# Patient Record
Sex: Male | Born: 1950 | Race: White | Hispanic: No | Smoking: Current every day smoker
Health system: Southern US, Community
[De-identification: ages and names within clinical notes are randomized; demographics above are authoritative.]

## PROBLEM LIST (undated history)

## (undated) DIAGNOSIS — J449 Chronic obstructive pulmonary disease, unspecified: Secondary | ICD-10-CM

## (undated) DIAGNOSIS — I2510 Atherosclerotic heart disease of native coronary artery without angina pectoris: Secondary | ICD-10-CM

## (undated) HISTORY — PX: NO PAST SURGERIES: SHX2092

---

## 2007-06-16 ENCOUNTER — Other Ambulatory Visit: Payer: Self-pay

## 2007-06-16 ENCOUNTER — Emergency Department: Payer: Self-pay | Admitting: Emergency Medicine

## 2010-10-19 ENCOUNTER — Inpatient Hospital Stay: Payer: Self-pay | Admitting: Internal Medicine

## 2010-12-12 ENCOUNTER — Ambulatory Visit: Payer: Self-pay | Admitting: Internal Medicine

## 2016-11-03 ENCOUNTER — Emergency Department
Admission: EM | Admit: 2016-11-03 | Discharge: 2016-11-04 | Disposition: A | Discharge status: Home / Self Care | Payer: Medicare Other | Attending: Emergency Medicine | Admitting: Emergency Medicine

## 2016-11-03 ENCOUNTER — Encounter: Payer: Self-pay | Admitting: Emergency Medicine

## 2016-11-03 DIAGNOSIS — H8149 Vertigo of central origin, unspecified ear: Secondary | ICD-10-CM | POA: Diagnosis not present

## 2016-11-03 DIAGNOSIS — F17210 Nicotine dependence, cigarettes, uncomplicated: Secondary | ICD-10-CM | POA: Diagnosis not present

## 2016-11-03 DIAGNOSIS — J449 Chronic obstructive pulmonary disease, unspecified: Secondary | ICD-10-CM | POA: Insufficient documentation

## 2016-11-03 DIAGNOSIS — R42 Dizziness and giddiness: Secondary | ICD-10-CM | POA: Diagnosis present

## 2016-11-03 HISTORY — DX: Chronic obstructive pulmonary disease, unspecified: J44.9

## 2016-11-03 LAB — CBC
HCT: 46.6 % (ref 40.0–52.0)
Hemoglobin: 15.6 g/dL (ref 13.0–18.0)
MCH: 29.5 pg (ref 26.0–34.0)
MCHC: 33.6 g/dL (ref 32.0–36.0)
MCV: 88 fL (ref 80.0–100.0)
PLATELETS: 237 10*3/uL (ref 150–440)
RBC: 5.29 MIL/uL (ref 4.40–5.90)
RDW: 13.8 % (ref 11.5–14.5)
WBC: 9.4 10*3/uL (ref 3.8–10.6)

## 2016-11-03 LAB — COMPREHENSIVE METABOLIC PANEL
ALBUMIN: 4.2 g/dL (ref 3.5–5.0)
ALK PHOS: 60 U/L (ref 38–126)
ALT: 17 U/L (ref 17–63)
AST: 20 U/L (ref 15–41)
Anion gap: 8 (ref 5–15)
BUN: 11 mg/dL (ref 6–20)
CALCIUM: 8.9 mg/dL (ref 8.9–10.3)
CO2: 26 mmol/L (ref 22–32)
CREATININE: 0.91 mg/dL (ref 0.61–1.24)
Chloride: 104 mmol/L (ref 101–111)
GFR calc Af Amer: >60 mL/min (ref >60)
GFR calc non Af Amer: >60 mL/min (ref >60)
GLUCOSE: 98 mg/dL (ref 65–99)
Potassium: 3.8 mmol/L (ref 3.5–5.1)
SODIUM: 138 mmol/L (ref 135–145)
Total Bilirubin: 0.9 mg/dL (ref 0.3–1.2)
Total Protein: 7.4 g/dL (ref 6.5–8.1)

## 2016-11-03 LAB — URINALYSIS, COMPLETE (UACMP) WITH MICROSCOPIC
Bacteria, UA: NONE SEEN
Bilirubin Urine: NEGATIVE
Glucose, UA: NEGATIVE mg/dL
Ketones, ur: NEGATIVE mg/dL
Nitrite: NEGATIVE
PH: 5 (ref 5.0–8.0)
Protein, ur: NEGATIVE mg/dL
SPECIFIC GRAVITY, URINE: 1.019 (ref 1.005–1.030)

## 2016-11-03 LAB — TROPONIN I: Troponin I: <0.03 ng/mL (ref <0.03)

## 2016-11-03 NOTE — ED Triage Notes (Signed)
Pt to triage via w/c with no distress noted; pt reports intermittent vertigo since Friday accomp by diaphoresis, nausea; denies hx of same

## 2016-11-04 ENCOUNTER — Encounter: Payer: Self-pay | Admitting: Radiology

## 2016-11-04 ENCOUNTER — Emergency Department: Payer: Medicare Other

## 2016-11-04 MED ORDER — SODIUM CHLORIDE 0.9 % IV BOLUS (SEPSIS)
1000.0000 mL | Freq: Once | INTRAVENOUS | Status: AC
  Administered 2016-11-04: 1000 mL via INTRAVENOUS

## 2016-11-04 MED ORDER — MECLIZINE HCL 25 MG PO TABS: 50.0000 mg | ORAL_TABLET | Freq: Two times a day (BID) | ORAL | 0 refills | Status: DC | PRN

## 2016-11-04 MED ORDER — IOPAMIDOL (ISOVUE-370) INJECTION 76%
75.0000 mL | Freq: Once | INTRAVENOUS | Status: AC | PRN
  Administered 2016-11-04: 75 mL via INTRAVENOUS

## 2016-11-04 MED ORDER — MECLIZINE HCL 25 MG PO TABS
50.0000 mg | ORAL_TABLET | Freq: Once | ORAL | Status: AC
  Administered 2016-11-04: 50 mg via ORAL
  Filled 2016-11-04: qty 2

## 2016-11-04 NOTE — ED Provider Notes (Signed)
[facility] Emergency Department Provider Note   First MD Initiated Contact with Patient 11/03/16 2348     (approximate)  I have reviewed the triage vital signs and the nursing notes.   HISTORY  Chief Complaint Dizziness    HPI Frederick Brewer is a 66 y.o. male with history of COPD presents emergency department with intermittent dizziness since Thursday (6 days) (the patient states sensation of the room spinning. Patient denies any headache no weakness numbness gait instability or visual changes. Patient does admit onset of dizziness.   Past Medical History:  Diagnosis Date  . COPD (chronic obstructive pulmonary disease) (HCC)     There are no active problems to display for this patient.   History reviewed. No pertinent surgical history.  Prior to Admission medications   Medication Sig Start Date End Date Taking? Authorizing Provider  meclizine (ANTIVERT) 25 MG tablet Take 2 tablets (50 mg total) by mouth 2 (two) times daily as needed. 11/04/16   Darci Current, MD    Allergies No known drug allergies No family history on file.  Social History Social History  Substance Use Topics  . Smoking status: Current Every Day Smoker    Packs/day: 1.00    Types: Cigarettes  . Smokeless tobacco: Never Used  . Alcohol use Not on file    Review of Systems Constitutional: No fever/chills Eyes: No visual changes. ENT: No sore throat. Cardiovascular: Denies chest pain. Respiratory: Denies shortness of breath. Gastrointestinal: No abdominal pain.  No nausea, no vomiting.  No diarrhea.  No constipation. Genitourinary: Negative for dysuria. Musculoskeletal: Negative for neck pain.  Negative for back pain. Integumentary: Negative for rash. Neurological: Negative for headaches, focal weakness or numbness.Positive for dizziness   ____________________________________________   PHYSICAL EXAM:  VITAL SIGNS: ED Triage Vitals [11/03/16 2119]  Enc  Vitals Group     BP (!) 176/80     Pulse Rate 81     Resp 18     Temp 98.4 F (36.9 C)     Temp Source Oral     SpO2 97 %     Weight 81.6 kg (180 lb)     Height 1.803 m (5\' 11" )     Head Circumference      Peak Flow      Pain Score      Pain Loc      Pain Edu?      Excl. in GC?     Constitutional: Alert and oriented. Well appearing and in no acute distress. Eyes: Conjunctivae are normal. PERRL. EOMI. Head: Atraumatic. Ears:  Healthy appearing ear canals and TMs bilaterally Nose: No congestion/rhinnorhea. Mouth/Throat: Mucous membranes are moist.  Oropharynx non-erythematous. Neck: No stridor. Cardiovascular: Normal rate, regular rhythm. Good peripheral circulation. Grossly normal heart sounds. Respiratory: Normal respiratory effort.  No retractions. Lungs CTAB. Gastrointestinal: Soft and nontender. No distention.  Musculoskeletal: No lower extremity tenderness nor edema. No gross deformities of extremities. Neurologic:  Normal speech and language. No gross focal neurologic deficits are appreciated.  Skin:  Skin is warm, dry and intact. No rash noted. Psychiatric: Mood and affect are normal. Speech and behavior are normal.  ____________________________________________   LABS (all labs ordered are listed, but only abnormal results are displayed)  Labs Reviewed  URINALYSIS, COMPLETE (UACMP) WITH MICROSCOPIC - Abnormal; Notable for the following:       Result Value   Color, Urine YELLOW (*)    APPearance CLEAR (*)    Hgb urine dipstick  MODERATE (*)    Leukocytes, UA TRACE (*)    Squamous Epithelial / LPF 0-5 (*)    All other components within normal limits  CBC  TROPONIN I  COMPREHENSIVE METABOLIC PANEL   ____________________________________________  EKG ED ECG REPORT I, Villanueva N BROWN, the attending physician, personally viewed and interpreted this ECG.   Date: 11/05/2016  EKG Time: 9:27 PM  Rate: 84  Rhythm: Normal sinus rhythm  Axis: Normal  Intervals:  Normal  ST&T Change: None   CLINICAL DATA:  Intermittent vertigo for 5 days, diaphoresis and nausea. Assess for subarachnoid hemorrhage or aneurysm. Smoker, history of COPD.  EXAM: CT ANGIOGRAPHY HEAD  TECHNIQUE: Multidetector CT imaging of the head was performed using the standard protocol during bolus administration of intravenous contrast. Multiplanar CT image reconstructions and MIPs were obtained to evaluate the vascular anatomy.  CONTRAST:  100 cc Isovue 370  COMPARISON:  CT HEAD July 17, 2007  FINDINGS: CT HEAD  BRAIN: No intraparenchymal hemorrhage, mass effect nor midline shift. The ventricles and sulci are normal for age ; mild chronic asymmetry of the lateral ventricles may be developmental. Small area RIGHT parietal encephalomalacia is unchanged. No acute large vascular territory infarcts. No abnormal extra-axial fluid collections. Basal cisterns are patent.  VASCULAR: Moderate calcific atherosclerosis of the carotid siphons.  SKULL: No skull fracture. No significant scalp soft tissue swelling.  SINUSES/ORBITS: The mastoid air-cells and included paranasal sinuses are well-aerated.The included ocular globes and orbital contents are non-suspicious.  OTHER: None.  CTA HEAD  ANTERIOR CIRCULATION: Patent cervical internal carotid arteries, petrous, cavernous and supra clinoid internal carotid arteries. Patent fenestrated anterior communicating artery. Patent anterior and middle cerebral arteries.  No large vessel occlusion, significant stenosis, contrast extravasation or aneurysm.  POSTERIOR CIRCULATION: Codominant vertebral artery's. Patent vertebral arteries, vertebrobasilar junction and basilar artery, as well as main branch vessels. Patent posterior cerebral arteries. Small RIGHT posterior communicating artery present.  No large vessel occlusion, significant stenosis, contrast extravasation or aneurysm.  VENOUS SINUSES: Major  dural venous sinuses are patent though not tailored for evaluation on this angiographic examination.  ANATOMIC VARIANTS: None.  DELAYED PHASE: No abnormal intracranial enhancement.  MIP images reviewed.  IMPRESSION: CT HEAD:  1. No acute intracranial process. 2. 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 3. Otherwise negative noncontrast CT HEAD for age. CTA HEAD:  1. No aneurysm; negative CTA of the head.   Electronically Signed   By: Awilda Metro M.D.   On: 11/04/2016 01:12   Procedures   ____________________________________________   INITIAL IMPRESSION / ASSESSMENT AND PLAN / ED COURSE  Pertinent labs & imaging results that were available during my care of the patient were reviewed by me and considered in my medical decision making (see chart for details).  Patient given meclizine 50 mg with resolution of symptoms. 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 Will prescribe meclizine for the patient for home.      ____________________________________________  FINAL CLINICAL IMPRESSION(S) / ED DIAGNOSES  Final diagnoses:  Vertigo     MEDICATIONS GIVEN DURING THIS VISIT:  Medications  sodium chloride 0.9 % bolus 1,000 mL (0 mLs Intravenous Stopped 11/04/16 0331)  meclizine (ANTIVERT) tablet 50 mg (50 mg Oral Given 11/04/16 0013)  iopamidol (ISOVUE-370) 76 % injection 75 mL (75 mLs Intravenous Contrast Given 11/04/16 0031)     NEW OUTPATIENT MEDICATIONS  STARTED DURING THIS VISIT:  Discharge Medication List as of 11/04/2016  3:16 AM    START taking these medications   Details  meclizine (ANTIVERT) 25 MG tablet Take 2 tablets (50 mg total) by mouth 2 (two) times daily as needed., Starting Wed 11/04/2016, Print        Discharge Medication List as of 11/04/2016  3:16 AM      Discharge  Medication List as of 11/04/2016  3:16 AM       Note:  This document was prepared using Dragon voice recognition software and may include unintentional dictation errors.    Darci CurrentBrown, [place], MD 11/05/16 [phone]

## 2016-11-04 NOTE — ED Notes (Signed)
Pt returned to ED Rm 9 from CT at this time. Pt reports significant improvement in his presenting c/o's. Pt requesting door to be closed and lights dimmed so he can sleep until CT scan results are available. Bed in lowest position and wheels locked; call bell within reach and pt instructed to call-out for any needs or concerns.

## 2016-11-04 NOTE — ED Notes (Signed)
Pt declined wheelchair, ambulatory without difficulty, steady gait noted. 

## 2016-11-04 NOTE — ED Notes (Signed)
Patient transported to CT at this time. 

## 2018-11-22 IMAGING — CT CT ANGIO HEAD
4 of 11 series · 16 of 47 positions shown · IV contrast (APPLIED)
Comparison: CT HEAD July 17, 2007

CLINICAL DATA: Intermittent vertigo for 5 days, diaphoresis and
nausea. Assess for subarachnoid hemorrhage or aneurysm. Smoker,
history of COPD.

EXAM:
CT ANGIOGRAPHY HEAD
TECHNIQUE: Multidetector CT imaging of the head was performed using the
standard protocol during bolus administration of intravenous
contrast. Multiplanar CT image reconstructions and MIPs were
obtained to evaluate the vascular anatomy.
CONTRAST:  100 cc Isovue 370

[Series 8: cta head · axial · 0.45mm/px · z∈[-38,+58]mm · 5 of 72 slices shown]
[im 12/72  brain]
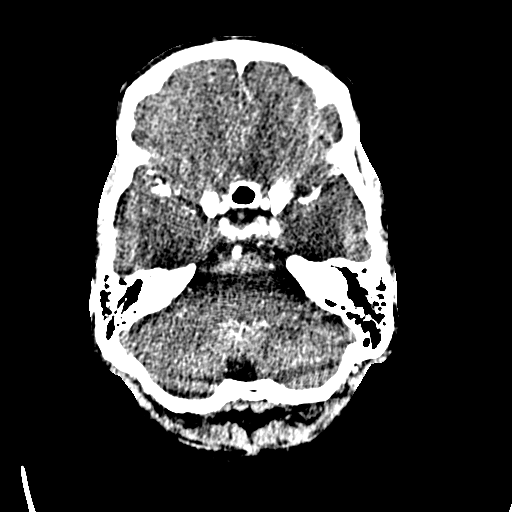
[im 24/72  bone]
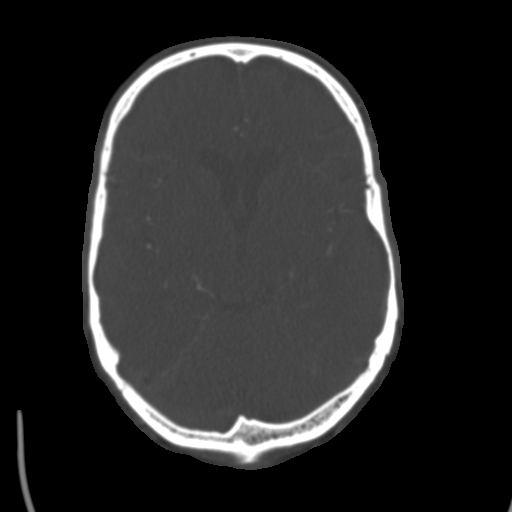
[im 36/72  brain]
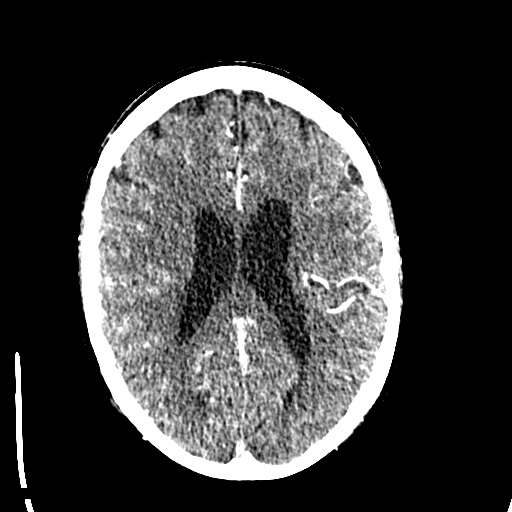
[im 48/72  bone]
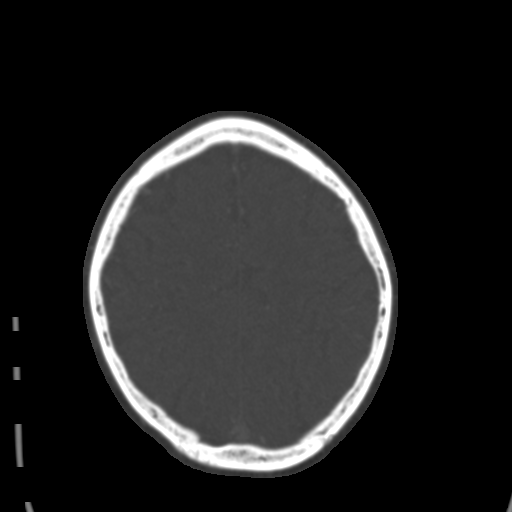
[im 60/72  brain]
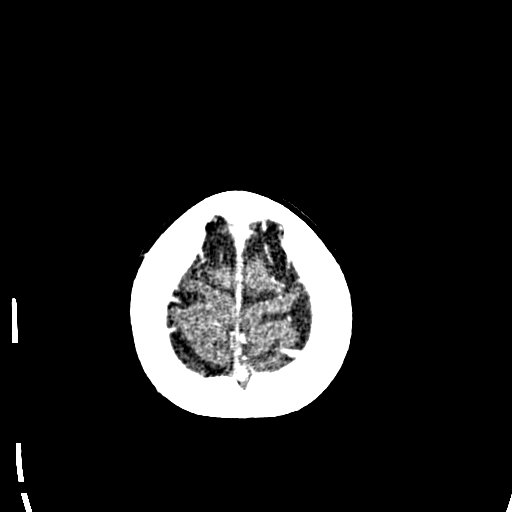

[Series 10: ax thin · axial · 0.39mm/px · z∈[-51,+11]mm · 5 of 141 slices shown]
[im 11/141  brain]
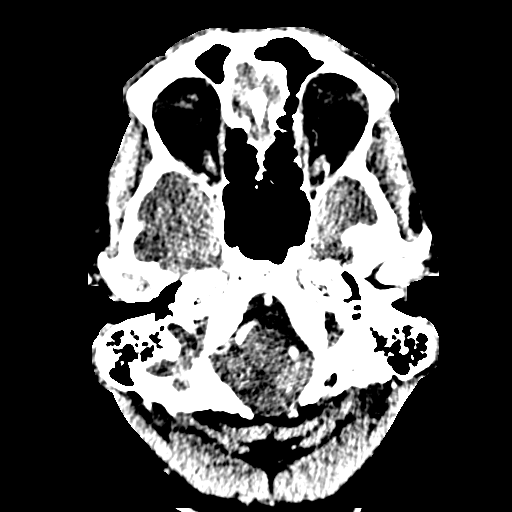
[im 33/141  brain]
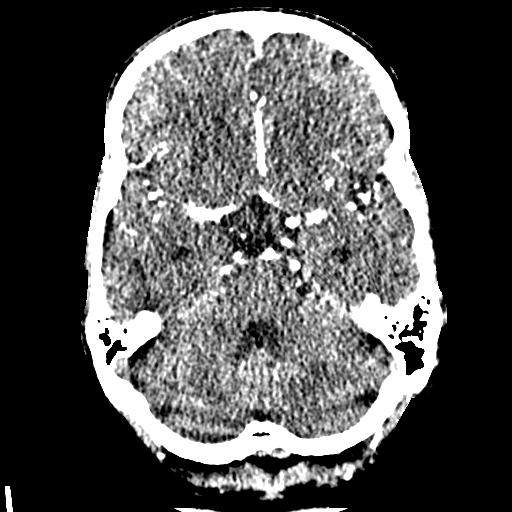
[im 44/141  brain]
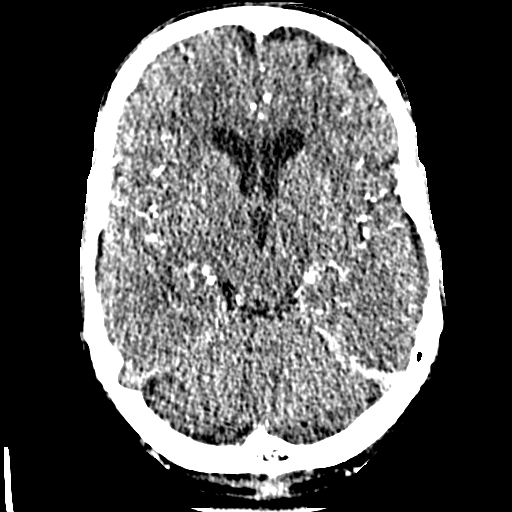
[im 65/141  brain]
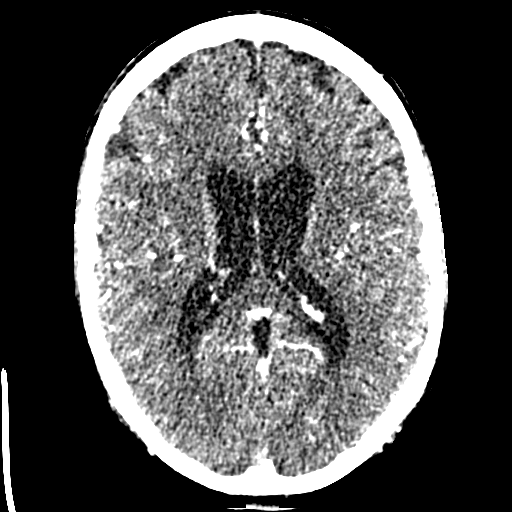
[im 76/141  brain]
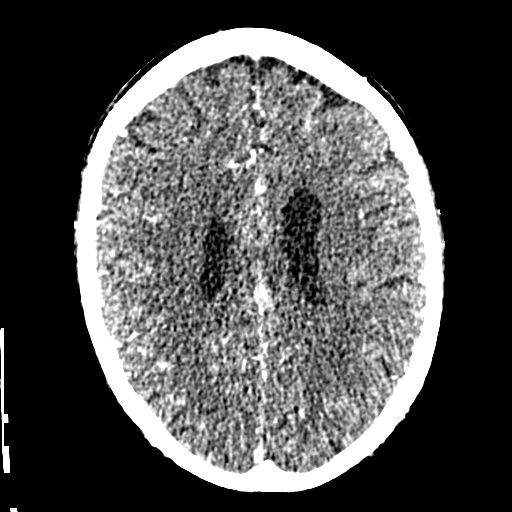

[Series 12: cor thin · coronal · 0.28mm/px · 3 of 199 slices shown]
[im 67/199  brain]
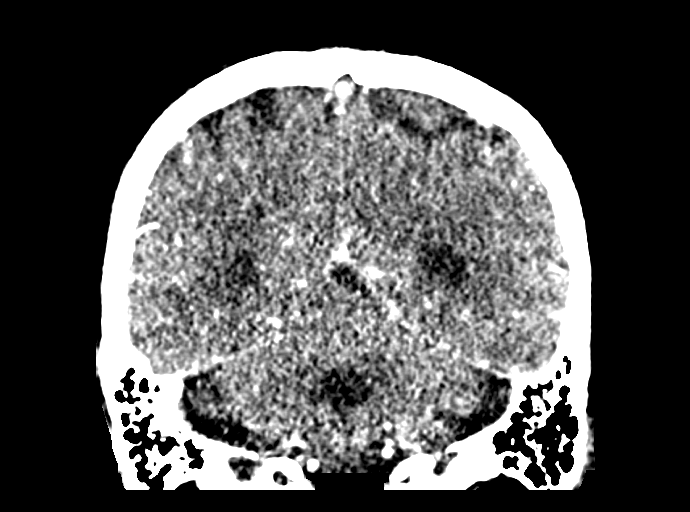
[im 100/199  brain]
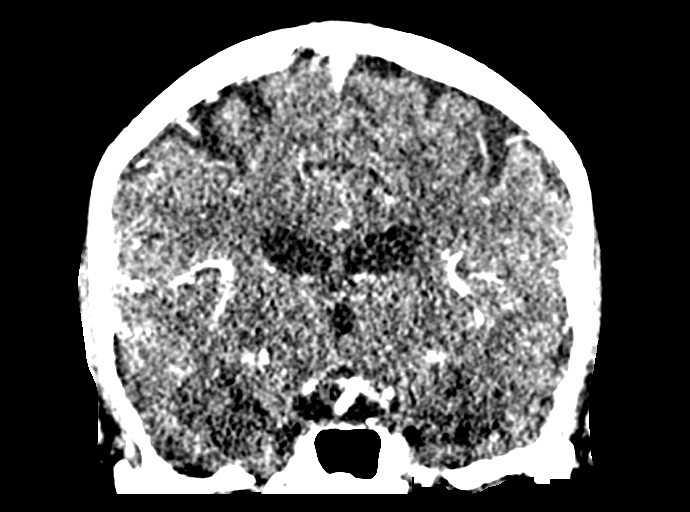
[im 133/199  brain]
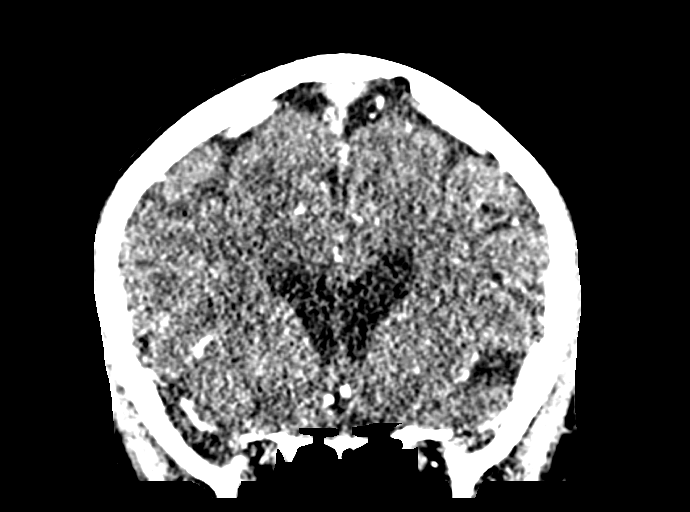

[Series 14: sag thin · sagittal · 0.29mm/px · 3 of 159 slices shown]
[im 40/159  brain]
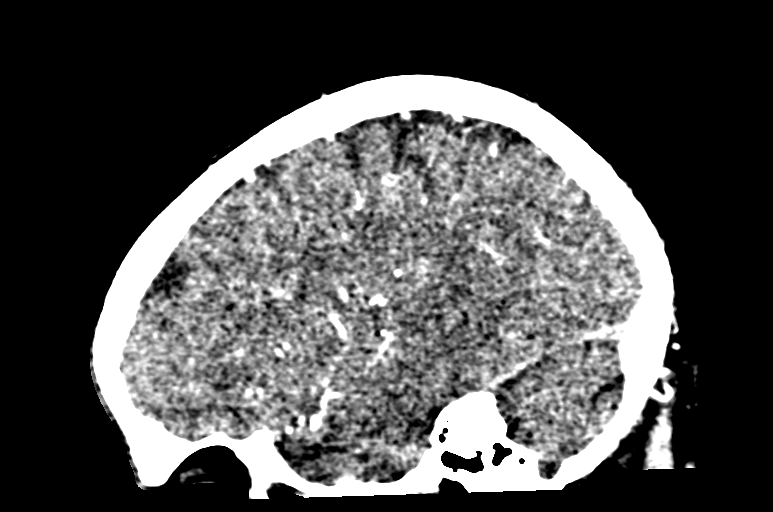
[im 80/159  brain]
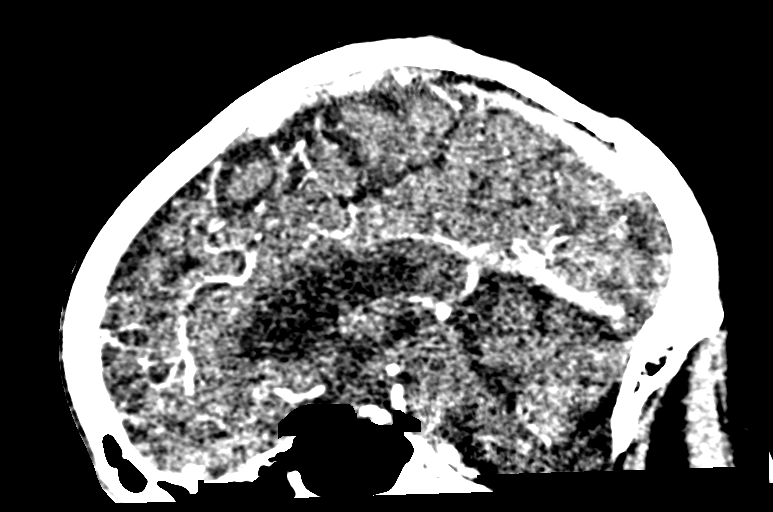
[im 119/159  brain]
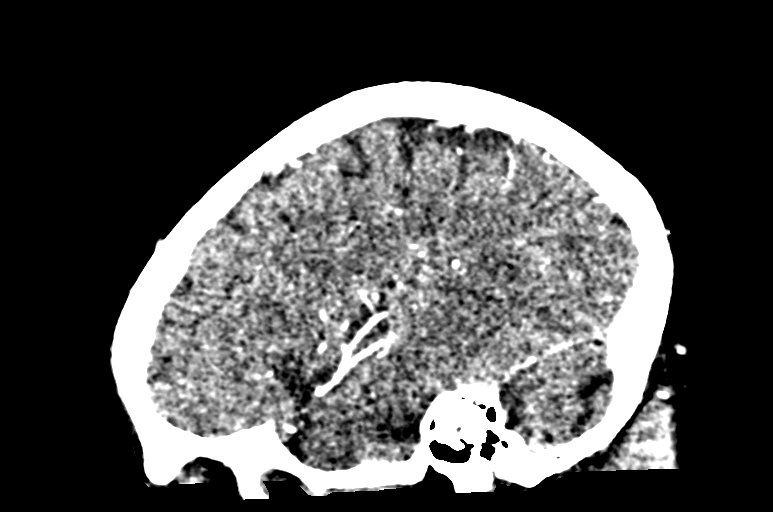

[16 of 47 positions shown; findings below may reference images not displayed]

FINDINGS: CT HEAD

BRAIN: No intraparenchymal hemorrhage, mass effect nor midline
shift. The ventricles and sulci are normal for age ; mild chronic
asymmetry of the lateral ventricles may be developmental. Small area
RIGHT parietal encephalomalacia is unchanged. No acute large
vascular territory infarcts. No abnormal extra-axial fluid
collections. Basal cisterns are patent.

VASCULAR: Moderate calcific atherosclerosis of the carotid siphons.

SKULL: No skull fracture. No significant scalp soft tissue swelling.

SINUSES/ORBITS: The mastoid air-cells and included paranasal sinuses
are well-aerated.The included ocular globes and orbital contents are
non-suspicious.

OTHER: None.

CTA HEAD

ANTERIOR CIRCULATION: Patent cervical internal carotid arteries,
petrous, cavernous and supra clinoid internal carotid arteries.
Patent fenestrated anterior communicating artery. Patent anterior
and middle cerebral arteries.

No large vessel occlusion, significant stenosis, contrast
extravasation or aneurysm.

POSTERIOR CIRCULATION: Codominant vertebral artery's. Patent
vertebral arteries, vertebrobasilar junction and basilar artery, as
well as main branch vessels. Patent posterior cerebral arteries.
Small RIGHT posterior communicating artery present.

No large vessel occlusion, significant stenosis, contrast
extravasation or aneurysm.

VENOUS SINUSES: Major dural venous sinuses are patent though not
tailored for evaluation on this angiographic examination.

ANATOMIC VARIANTS: None.

DELAYED PHASE: No abnormal intracranial enhancement.

MIP images reviewed.
IMPRESSION: CT HEAD:

1. No acute intracranial process.
2. Small area RIGHT parietal encephalomalacia may be posttraumatic
or, old MCA infarct.
3. Otherwise negative noncontrast CT HEAD for age.
CTA HEAD:

1. No aneurysm; negative CTA of the head.

## 2022-03-20 ENCOUNTER — Inpatient Hospital Stay
Admission: EM | Admit: 2022-03-20 | Discharge: 2022-03-21 | DRG: 208 | Disposition: A | Discharge status: Other Acute Inpatient Hospital | Payer: Medicare PPO | Attending: Pulmonary Disease | Admitting: Pulmonary Disease

## 2022-03-20 ENCOUNTER — Emergency Department: Payer: Medicare PPO

## 2022-03-20 DIAGNOSIS — F17210 Nicotine dependence, cigarettes, uncomplicated: Secondary | ICD-10-CM | POA: Diagnosis present

## 2022-03-20 DIAGNOSIS — Z880 Allergy status to penicillin: Secondary | ICD-10-CM

## 2022-03-20 DIAGNOSIS — J205 Acute bronchitis due to respiratory syncytial virus: Secondary | ICD-10-CM | POA: Diagnosis present

## 2022-03-20 DIAGNOSIS — J440 Chronic obstructive pulmonary disease with acute lower respiratory infection: Secondary | ICD-10-CM | POA: Diagnosis present

## 2022-03-20 DIAGNOSIS — J9601 Acute respiratory failure with hypoxia: Secondary | ICD-10-CM | POA: Diagnosis present

## 2022-03-20 DIAGNOSIS — E871 Hypo-osmolality and hyponatremia: Secondary | ICD-10-CM | POA: Diagnosis not present

## 2022-03-20 DIAGNOSIS — R6510 Systemic inflammatory response syndrome (SIRS) of non-infectious origin without acute organ dysfunction: Secondary | ICD-10-CM | POA: Diagnosis present

## 2022-03-20 DIAGNOSIS — J441 Chronic obstructive pulmonary disease with (acute) exacerbation: Principal | ICD-10-CM | POA: Diagnosis present

## 2022-03-20 DIAGNOSIS — J439 Emphysema, unspecified: Secondary | ICD-10-CM | POA: Diagnosis present

## 2022-03-20 DIAGNOSIS — R1032 Left lower quadrant pain: Secondary | ICD-10-CM | POA: Diagnosis present

## 2022-03-20 DIAGNOSIS — J9602 Acute respiratory failure with hypercapnia: Secondary | ICD-10-CM

## 2022-03-20 DIAGNOSIS — J121 Respiratory syncytial virus pneumonia: Secondary | ICD-10-CM | POA: Diagnosis not present

## 2022-03-20 DIAGNOSIS — Z8042 Family history of malignant neoplasm of prostate: Secondary | ICD-10-CM

## 2022-03-20 DIAGNOSIS — I517 Cardiomegaly: Secondary | ICD-10-CM | POA: Diagnosis present

## 2022-03-20 DIAGNOSIS — Z1152 Encounter for screening for COVID-19: Secondary | ICD-10-CM | POA: Diagnosis not present

## 2022-03-20 LAB — CBC WITH DIFFERENTIAL/PLATELET
Abs Immature Granulocytes: 0.02 10*3/uL (ref 0.00–0.07)
Basophils Absolute: 0 10*3/uL (ref 0.0–0.1)
Basophils Relative: 0 %
Eosinophils Absolute: 0.1 10*3/uL (ref 0.0–0.5)
Eosinophils Relative: 1 %
HCT: 48.5 % (ref 39.0–52.0)
Hemoglobin: 15.5 g/dL (ref 13.0–17.0)
Immature Granulocytes: 0 %
Lymphocytes Relative: 27 %
Lymphs Abs: 2.5 10*3/uL (ref 0.7–4.0)
MCH: 29.3 pg (ref 26.0–34.0)
MCHC: 32 g/dL (ref 30.0–36.0)
MCV: 91.7 fL (ref 80.0–100.0)
Monocytes Absolute: 0.8 10*3/uL (ref 0.1–1.0)
Monocytes Relative: 9 %
Neutro Abs: 5.9 10*3/uL (ref 1.7–7.7)
Neutrophils Relative %: 63 %
Platelets: 251 10*3/uL (ref 150–400)
RBC: 5.29 MIL/uL (ref 4.22–5.81)
RDW: 12.2 % (ref 11.5–15.5)
WBC: 9.4 10*3/uL (ref 4.0–10.5)
nRBC: 0 % (ref 0.0–0.2)

## 2022-03-20 LAB — BLOOD GAS, VENOUS
Acid-Base Excess: 7.7 mmol/L — ABNORMAL HIGH (ref 0.0–2.0)
Bicarbonate: 37.6 mmol/L — ABNORMAL HIGH (ref 20.0–28.0)
Delivery systems: POSITIVE
O2 Saturation: 46.4 %
Patient temperature: 37
pCO2, Ven: 80 mmHg (ref 44–60)
pH, Ven: 7.28 (ref 7.25–7.43)
pO2, Ven: 36 mmHg (ref 32–45)

## 2022-03-20 LAB — RESP PANEL BY RT-PCR (RSV, FLU A&B, COVID)  RVPGX2
Influenza A by PCR: NEGATIVE
Influenza B by PCR: NEGATIVE
Resp Syncytial Virus by PCR: POSITIVE — AB
SARS Coronavirus 2 by RT PCR: NEGATIVE

## 2022-03-20 LAB — COMPREHENSIVE METABOLIC PANEL
ALT: 26 U/L (ref 0–44)
AST: 36 U/L (ref 15–41)
Albumin: 4.1 g/dL (ref 3.5–5.0)
Alkaline Phosphatase: 68 U/L (ref 38–126)
Anion gap: 10 (ref 5–15)
BUN: 15 mg/dL (ref 8–23)
CO2: 33 mmol/L — ABNORMAL HIGH (ref 22–32)
Calcium: 9.2 mg/dL (ref 8.9–10.3)
Chloride: 92 mmol/L — ABNORMAL LOW (ref 98–111)
Creatinine, Ser: 0.78 mg/dL (ref 0.61–1.24)
GFR, Estimated: >60 mL/min (ref >60)
Glucose, Bld: 179 mg/dL — ABNORMAL HIGH (ref 70–99)
Potassium: 4.5 mmol/L (ref 3.5–5.1)
Sodium: 135 mmol/L (ref 135–145)
Total Bilirubin: 0.7 mg/dL (ref 0.3–1.2)
Total Protein: 7.9 g/dL (ref 6.5–8.1)

## 2022-03-20 LAB — TROPONIN I (HIGH SENSITIVITY): Troponin I (High Sensitivity): 6 ng/L (ref <18)

## 2022-03-20 MED ORDER — LEVOFLOXACIN IN D5W 750 MG/150ML IV SOLN
750.0000 mg | INTRAVENOUS | Status: DC
  Administered 2022-03-21: 750 mg via INTRAVENOUS
  Filled 2022-03-20: qty 150

## 2022-03-20 MED ORDER — SODIUM CHLORIDE 0.9 % IV SOLN: INTRAVENOUS | Status: DC

## 2022-03-20 MED ORDER — ACETAMINOPHEN 325 MG RE SUPP: 650.0000 mg | Freq: Four times a day (QID) | RECTAL | Status: DC | PRN

## 2022-03-20 MED ORDER — ONDANSETRON HCL 4 MG PO TABS: 4.0000 mg | ORAL_TABLET | Freq: Four times a day (QID) | ORAL | Status: DC | PRN

## 2022-03-20 MED ORDER — ACETAMINOPHEN 325 MG PO TABS: 650.0000 mg | ORAL_TABLET | Freq: Four times a day (QID) | ORAL | Status: DC | PRN

## 2022-03-20 MED ORDER — ALBUTEROL SULFATE (2.5 MG/3ML) 0.083% IN NEBU
2.5000 mg | INHALATION_SOLUTION | Freq: Once | RESPIRATORY_TRACT | Status: AC
  Administered 2022-03-21: 2.5 mg via RESPIRATORY_TRACT
  Filled 2022-03-20: qty 3

## 2022-03-20 MED ORDER — ENOXAPARIN SODIUM 40 MG/0.4ML IJ SOSY
40.0000 mg | PREFILLED_SYRINGE | INTRAMUSCULAR | Status: DC
  Administered 2022-03-21: 40 mg via SUBCUTANEOUS
  Filled 2022-03-20: qty 0.4

## 2022-03-20 MED ORDER — MAGNESIUM HYDROXIDE 400 MG/5ML PO SUSP: 30.0000 mL | Freq: Every day | ORAL | Status: DC | PRN

## 2022-03-20 MED ORDER — IPRATROPIUM-ALBUTEROL 0.5-2.5 (3) MG/3ML IN SOLN
3.0000 mL | Freq: Once | RESPIRATORY_TRACT | Status: AC
Start: 2022-03-20 — End: 2022-03-20
  Administered 2022-03-20: 3 mL via RESPIRATORY_TRACT
  Filled 2022-03-20: qty 3

## 2022-03-20 MED ORDER — ONDANSETRON HCL 4 MG/2ML IJ SOLN: 4.0000 mg | Freq: Four times a day (QID) | INTRAMUSCULAR | Status: DC | PRN

## 2022-03-20 MED ORDER — TRAZODONE HCL 50 MG PO TABS: 25.0000 mg | ORAL_TABLET | Freq: Every evening | ORAL | Status: DC | PRN

## 2022-03-20 NOTE — ED Triage Notes (Signed)
Pt from home for resp. Distress.  Ems reports 70% RA sat.  2 duoneb, 2 albuterol, 125 Solumedrol, 2 G mag in route.  Pt has + CoVID and RSV contacts.  No Hx of CPAP or intubation.

## 2022-03-20 NOTE — H&P (Signed)
[place]   PATIENT NAME: Frederick Brewer    MR#:  [id number]  DATE OF BIRTH:  1950/08/14  DATE OF ADMISSION:  03/20/2022  PRIMARY CARE PHYSICIAN: Medicine, [facility]   Patient is coming from: Home  REQUESTING/REFERRING PHYSICIAN: Dorothea Glassman, MD  CHIEF COMPLAINT:   Chief Complaint  Patient presents with   Respiratory Distress    HISTORY OF PRESENT ILLNESS:  Frederick Brewer is a 71 y.o. male with medical history significant for COPD, who presented to the emergency room with acute onset of worsening dyspnea with associated cough productive of greenish and yellowish sputum as well as wheezing since Monday.  He denied any fever or chills.  He admitted to left lower quadrant abdominal pain that he feels was related to pulled muscle with cough.  No chest pain or palpitations.  No nausea or vomiting or diarrhea.  No melena or bright red bleeding per rectum.  No dysuria, oliguria or hematuria or flank pain.  The patient was in significant respiratory distress he was placed on CPAP by EMS.  He was switched to BiPAP in the ER.  ED Course: Upon presentation to the emergency room, BP was 164/79 with heart rate of 101 with respiratory rate of 24 and pulse oximetry was 99% on 40% FiO2 on BiPAP. EKG as reviewed by me : EKG showed normal sinus rhythm with a rate of 91 with right atrial enlargement Imaging: Portable chest x-ray showed advanced emphysematous changes without evidence of acute cardiopulmonary process.  The patient was given 2 DuoNebs, IV Solu-Medrol and IV magnesium by EMS and 1 DuoNeb here.  He will be admitted to a progressive bed for further evaluation and management. PAST MEDICAL HISTORY:   Past Medical History:  Diagnosis Date   COPD (chronic obstructive pulmonary disease) (HCC)     PAST SURGICAL HISTORY:  No past surgical history on file.  No previous surgeries.  SOCIAL HISTORY:   Social History   Tobacco Use   Smoking status: Every Day    Packs/day:  1.00    Types: Cigarettes   Smokeless tobacco: Never  Substance Use Topics   Alcohol use: Not on file    FAMILY HISTORY:   Positive for prostate cancer in his father.  DRUG ALLERGIES:   Allergies  Allergen Reactions   Penicillins     REVIEW OF SYSTEMS:   ROS As per history of present illness. All pertinent systems were reviewed above. Constitutional, HEENT, cardiovascular, respiratory, GI, GU, musculoskeletal, neuro, psychiatric, endocrine, integumentary and hematologic systems were reviewed and are otherwise negative/unremarkable except for positive findings mentioned above in the HPI.   MEDICATIONS AT HOME:   Prior to Admission medications   Medication Sig Start Date End Date Taking? Authorizing Provider  meclizine (ANTIVERT) 25 MG tablet Take 2 tablets (50 mg total) by mouth 2 (two) times daily as needed. Patient not taking: Reported on 03/20/2022 11/04/16   Darci Current, MD      VITAL SIGNS:  Blood pressure (!) 147/80, pulse 85, temperature 97.6 F (36.4 C), temperature source Axillary, resp. rate 15, height 5\' 11"  (1.803 m), weight 75.8 kg, SpO2 98 %.  PHYSICAL EXAMINATION:  Physical Exam  GENERAL: Acutely ill 71 y.o.-year-old cog male patient lying in the bed with mild to moderate respiratory distress on BiPAP with conversational dyspnea EYES: Pupils equal, round, reactive to light and accommodation. No scleral icterus. Extraocular muscles intact.  HEENT: Head atraumatic, normocephalic. Oropharynx and nasopharynx clear.  NECK:  Supple,  no jugular venous distention. No thyroid enlargement, no tenderness.  LUNGS: Diffuse expiratory wheezes with diminished expiratory airflow and harsh vesicular breathing with mild use of accessory muscles of respiration.  CARDIOVASCULAR: Regular rate and rhythm, S1, S2 normal. No murmurs, rubs, or gallops.  ABDOMEN: Soft, nondistended, nontender. Bowel sounds present. No organomegaly or mass.  EXTREMITIES: No pedal edema,  cyanosis, or clubbing.  NEUROLOGIC: Cranial nerves II through XII are intact. Muscle strength 5/5 in all extremities. Sensation intact. Gait not checked.  PSYCHIATRIC: The patient is alert and oriented x 3.  Normal affect and good eye contact. SKIN: No obvious rash, lesion, or ulcer.   LABORATORY PANEL:   CBC Recent Labs  Lab 03/20/22 2248  WBC 9.4  HGB 15.5  HCT 48.5  PLT 251   ------------------------------------------------------------------------------------------------------------------  Chemistries  Recent Labs  Lab 03/20/22 2248  NA 135  K 4.5  CL 92*  CO2 33*  GLUCOSE 179*  BUN 15  CREATININE 0.78  CALCIUM 9.2  AST 36  ALT 26  ALKPHOS 68  BILITOT 0.7   ------------------------------------------------------------------------------------------------------------------  Cardiac Enzymes No results for input(s): "TROPONINI" in the last 168 hours. ------------------------------------------------------------------------------------------------------------------  RADIOLOGY:  DG Chest Portable 1 View  Result Date: 03/20/2022 CLINICAL DATA:  Shortness of breath. EXAM: PORTABLE CHEST 1 VIEW COMPARISON:  CT chest dated December 12, 2010 FINDINGS: The heart size and mediastinal contours are within normal limits. Advanced emphysematous changes with large emphysematous bulla in the bilateral upper lobes. No evidence of pneumothorax. No focal consolidation or large pleural effusion. The visualized skeletal structures are unremarkable. IMPRESSION: Advanced emphysematous changes without evidence of acute cardiopulmonary process. Electronically Signed   By: Larose Hires D.O.   On: 03/20/2022 23:12      IMPRESSION AND PLAN:  Assessment and Plan: * Acute respiratory failure with hypoxia and hypercapnia (HCC) - This is clearly secondary to COPD exacerbation. - The patient will be admitted to a progressive unit bed. - She will be continued on BiPAP. - We will follow ABGs. -  O2 protocol will be followed.  COPD exacerbation (HCC) - This is likely secondary to acute bronchitis. - The patient will be placed on nebulized DuoNebs 4 times daily and every 4 hours as needed. - Mucolytic therapy will be provided. - We will start him on IV antibiotic therapy with IV Levaquin given excessive purulent expectoration.   DVT prophylaxis: Lovenox. Advanced Care Planning:  Code Status: full code.   Family Communication:  The plan of care was discussed in details with the patient (and family). I answered all questions. The patient agreed to proceed with the above mentioned plan. Further management will depend upon hospital course. Disposition Plan: Back to previous home environment Consults called: none. All the records are reviewed and case discussed with ED provider.  Status is: Inpatient.  At the time of the admission, it appears that the appropriate admission status for this patient is inpatient.  This is judged to be reasonable and necessary in order to provide the required intensity of service to ensure the patient's safety given the presenting symptoms, physical exam findings and initial radiographic and laboratory data in the context of comorbid conditions.  The patient requires inpatient status due to high intensity of service, high risk of further deterioration and high frequency of surveillance required.  I certify that at the time of admission, it is my clinical judgment that the patient will require inpatient hospital care extending more than 2 midnights.  Dispo: The patient is from: Home              Anticipated d/c is to: Home              Patient currently is not medically stable to d/c.              Difficult to place patient: No  Authorized and performed by: Valente David, MD Total critical care time: Approximately  55   minutes. Due to a high probability of clinically significant, life-threatening deterioration, the patient required  my highest level of preparedness to intervene emergently and I personally spent this critical care time directly and personally managing the patient.  This critical care time included obtaining a history, examining the patient, pulse oximetry, ordering and review of studies, arranging urgent treatment with development of management plan, evaluation of patient's response to treatment, frequent reassessment, and discussions with other providers. This critical care time was performed to assess and manage the high probability of imminent, life-threatening deterioration that could result in multiorgan failure.  It was exclusive of separately billable procedures and treating other patients and teaching time.    Hannah Beat M.D on 03/21/2022 at 3:02 AM  [facility]   From 7 PM-7 AM, contact night-coverage [email or web address]  CC: Primary care physician; Medicine, [facility]

## 2022-03-20 NOTE — ED Provider Notes (Signed)
[facility] Provider Note    Event Date/Time   First MD Initiated Contact with Patient 03/20/22 2303     (approximate)   History   Respiratory Distress   HPI  Frederick Brewer is a 71 y.o. male patient with COPD brought in by EMS very tight.  He got 2 albuterol nebs to DuoNebs Solu-Medrol and magnesium in the ambulance and put on CPAP here he is put on BiPAP he still very tight working very hard to breathe but able to speak in several word sentences now.      Physical Exam   Triage Vital Signs: ED Triage Vitals  Enc Vitals Group     BP 03/20/22 2243 (!) 164/79     Pulse Rate 03/20/22 2243 (!) 101     Resp 03/20/22 2243 (!) 24     Temp 03/20/22 2243 97.6 F (36.4 C)     Temp Source 03/20/22 2243 Axillary     SpO2 03/20/22 2243 99 %     Weight 03/20/22 2252 167 lb (75.8 kg)     Height 03/20/22 2252 5\' 11"  (1.803 m)     Head Circumference --      Peak Flow --      Pain Score 03/20/22 2251 0     Pain Loc --      Pain Edu? --      Excl. in GC? --     Most recent vital signs: Vitals:   03/20/22 2300 03/20/22 2330  BP: (!) 149/81 (!) 152/77  Pulse: 93 87  Resp: (!) 27 20  Temp:    SpO2: 100% 99%     General: Awake, in respiratory distress CV:  Good peripheral perfusion.  Heart regular rate and rhythm no audible murmurs Resp:  Increased effort decreased breath sounds Abd:  No distention.  Soft and nontender Extremities with no edema   ED Results / Procedures / Treatments   Labs (all labs ordered are listed, but only abnormal results are displayed) Labs Reviewed  COMPREHENSIVE METABOLIC PANEL - Abnormal; Notable for the following components:      Result Value   Chloride 92 (*)    CO2 33 (*)    Glucose, Bld 179 (*)    All other components within normal limits  BLOOD GAS, VENOUS - Abnormal; Notable for the following components:   pCO2, Ven 80 (*)    Bicarbonate 37.6 (*)    Acid-Base Excess 7.7 (*)    All other components within  normal limits  RESP PANEL BY RT-PCR (RSV, FLU A&B, COVID)  [id number]  CBC WITH DIFFERENTIAL/PLATELET  TROPONIN I (HIGH SENSITIVITY)     EKG  EKG read interpreted by me shows normal sinus rhythm rate of 91 normal axis nonspecific ST-T changes   RADIOLOGY Chest x-ray read by radiology reviewed and interpreted by me she only shows hyperinflation consistent with acute emphysema   PROCEDURES:  Critical Care performed: Critical care time 20 minutes.  This includes evaluating the patient when he came and reviewing his studies Now I am talking to the hospitalist.  Procedures   MEDICATIONS ORDERED IN ED: Medications  ipratropium-albuterol (DUONEB) 0.5-2.5 (3) MG/3ML nebulizer solution 3 mL (3 mLs Nebulization Given 03/20/22 2250)     IMPRESSION / MDM / ASSESSMENT AND PLAN / ED COURSE  I reviewed the triage vital signs and the nursing notes.  Patient's VBG comes back pH 7.28 CO2 of 80 CO2 of 36.  Patient is doing much better on BiPAP  since this test has been done.  Anticipate repeating this Differential diagnosis includes, but is not limited to, COPD exacerbation pneumonia emphysema  Patient's presentation is most consistent with acute presentation with potential threat to life or bodily function.  The patient is on the cardiac monitor to evaluate for evidence of arrhythmia and/or significant heart rate changes.  None were seen  Oncoming physician will call hospitalist shortly  FINAL CLINICAL IMPRESSION(S) / ED DIAGNOSES   Final diagnoses:  COPD exacerbation (HCC)  Acute respiratory failure with hypercapnia (HCC)     Rx / DC Orders   ED Discharge Orders     None        Note:  This document was prepared using Dragon voice recognition software and may include unintentional dictation errors.   Arnaldo Natal, MD 03/20/22 2337

## 2022-03-21 ENCOUNTER — Inpatient Hospital Stay (HOSPITAL_COMMUNITY): Payer: Medicare PPO

## 2022-03-21 ENCOUNTER — Inpatient Hospital Stay: Payer: Medicare PPO

## 2022-03-21 ENCOUNTER — Encounter (HOSPITAL_COMMUNITY): Payer: Self-pay

## 2022-03-21 ENCOUNTER — Inpatient Hospital Stay (HOSPITAL_COMMUNITY)
Admission: EM | Admit: 2022-03-21 | Discharge: 2022-03-27 | DRG: 208 | Disposition: A | Discharge status: Home Health | Payer: Medicare PPO | Source: Other Acute Inpatient Hospital | Attending: Internal Medicine | Admitting: Internal Medicine

## 2022-03-21 DIAGNOSIS — J441 Chronic obstructive pulmonary disease with (acute) exacerbation: Secondary | ICD-10-CM | POA: Diagnosis not present

## 2022-03-21 DIAGNOSIS — R7301 Impaired fasting glucose: Secondary | ICD-10-CM | POA: Diagnosis not present

## 2022-03-21 DIAGNOSIS — D649 Anemia, unspecified: Secondary | ICD-10-CM | POA: Diagnosis present

## 2022-03-21 DIAGNOSIS — F17210 Nicotine dependence, cigarettes, uncomplicated: Secondary | ICD-10-CM | POA: Diagnosis present

## 2022-03-21 DIAGNOSIS — J121 Respiratory syncytial virus pneumonia: Secondary | ICD-10-CM

## 2022-03-21 DIAGNOSIS — I952 Hypotension due to drugs: Secondary | ICD-10-CM | POA: Diagnosis present

## 2022-03-21 DIAGNOSIS — Z7951 Long term (current) use of inhaled steroids: Secondary | ICD-10-CM | POA: Diagnosis not present

## 2022-03-21 DIAGNOSIS — J205 Acute bronchitis due to respiratory syncytial virus: Secondary | ICD-10-CM | POA: Diagnosis present

## 2022-03-21 DIAGNOSIS — Z23 Encounter for immunization: Secondary | ICD-10-CM

## 2022-03-21 DIAGNOSIS — B338 Other specified viral diseases: Secondary | ICD-10-CM | POA: Diagnosis not present

## 2022-03-21 DIAGNOSIS — E871 Hypo-osmolality and hyponatremia: Secondary | ICD-10-CM | POA: Diagnosis not present

## 2022-03-21 DIAGNOSIS — Z79899 Other long term (current) drug therapy: Secondary | ICD-10-CM

## 2022-03-21 DIAGNOSIS — J439 Emphysema, unspecified: Secondary | ICD-10-CM | POA: Diagnosis present

## 2022-03-21 DIAGNOSIS — I2510 Atherosclerotic heart disease of native coronary artery without angina pectoris: Secondary | ICD-10-CM | POA: Diagnosis present

## 2022-03-21 DIAGNOSIS — J9602 Acute respiratory failure with hypercapnia: Secondary | ICD-10-CM | POA: Diagnosis not present

## 2022-03-21 DIAGNOSIS — T41295A Adverse effect of other general anesthetics, initial encounter: Secondary | ICD-10-CM | POA: Diagnosis present

## 2022-03-21 DIAGNOSIS — J9601 Acute respiratory failure with hypoxia: Principal | ICD-10-CM | POA: Diagnosis present

## 2022-03-21 DIAGNOSIS — Z781 Physical restraint status: Secondary | ICD-10-CM

## 2022-03-21 DIAGNOSIS — T380X5A Adverse effect of glucocorticoids and synthetic analogues, initial encounter: Secondary | ICD-10-CM | POA: Diagnosis not present

## 2022-03-21 LAB — PROCALCITONIN: Procalcitonin: <0.1 ng/mL

## 2022-03-21 LAB — BLOOD GAS, ARTERIAL
Acid-Base Excess: 7.8 mmol/L — ABNORMAL HIGH (ref 0.0–2.0)
Acid-Base Excess: 8.5 mmol/L — ABNORMAL HIGH (ref 0.0–2.0)
Acid-Base Excess: 8.3 mmol/L — ABNORMAL HIGH (ref 0.0–2.0)
Acid-Base Excess: 7 mmol/L — ABNORMAL HIGH (ref 0.0–2.0)
Bicarbonate: 34.2 mmol/L — ABNORMAL HIGH (ref 20.0–28.0)
Bicarbonate: 38.5 mmol/L — ABNORMAL HIGH (ref 20.0–28.0)
Bicarbonate: 38 mmol/L — ABNORMAL HIGH (ref 20.0–28.0)
Bicarbonate: 35.1 mmol/L — ABNORMAL HIGH (ref 20.0–28.0)
Delivery systems: POSITIVE
Expiratory PAP: 5 cmH2O
FIO2: 40 %
FIO2: 40 %
FIO2: 40 %
FIO2: 60 %
Inspiratory PAP: 12 cmH2O
MECHVT: 450 mL
MECHVT: 450 mL
MECHVT: 600 mL
Mechanical Rate: 18
Mechanical Rate: 22
O2 Saturation: 99.8 %
O2 Saturation: 97.3 %
O2 Saturation: 98.4 %
O2 Saturation: 100 %
PEEP: 5 cmH2O
PEEP: 5 cmH2O
PEEP: 5 cmH2O
Patient temperature: 37
Patient temperature: 37
Patient temperature: 37
Patient temperature: 37.3
RATE: 18 resp/min
pCO2 arterial: 54 mmHg — ABNORMAL HIGH (ref 32–48)
pCO2 arterial: 82 mmHg (ref 32–48)
pCO2 arterial: 79 mmHg (ref 32–48)
pCO2 arterial: 66 mmHg (ref 32–48)
pH, Arterial: 7.41 (ref 7.35–7.45)
pH, Arterial: 7.28 — ABNORMAL LOW (ref 7.35–7.45)
pH, Arterial: 7.29 — ABNORMAL LOW (ref 7.35–7.45)
pH, Arterial: 7.34 — ABNORMAL LOW (ref 7.35–7.45)
pO2, Arterial: 146 mmHg — ABNORMAL HIGH (ref 83–108)
pO2, Arterial: 86 mmHg (ref 83–108)
pO2, Arterial: 89 mmHg (ref 83–108)
pO2, Arterial: 187 mmHg — ABNORMAL HIGH (ref 83–108)

## 2022-03-21 LAB — BASIC METABOLIC PANEL
Anion gap: 8 (ref 5–15)
BUN: 14 mg/dL (ref 8–23)
CO2: 33 mmol/L — ABNORMAL HIGH (ref 22–32)
Calcium: 8.7 mg/dL — ABNORMAL LOW (ref 8.9–10.3)
Chloride: 92 mmol/L — ABNORMAL LOW (ref 98–111)
Creatinine, Ser: 0.7 mg/dL (ref 0.61–1.24)
GFR, Estimated: >60 mL/min (ref >60)
Glucose, Bld: 150 mg/dL — ABNORMAL HIGH (ref 70–99)
Potassium: 4.2 mmol/L (ref 3.5–5.1)
Sodium: 133 mmol/L — ABNORMAL LOW (ref 135–145)

## 2022-03-21 LAB — CBC
HCT: 44.6 % (ref 39.0–52.0)
Hemoglobin: 14.8 g/dL (ref 13.0–17.0)
MCH: 30.2 pg (ref 26.0–34.0)
MCHC: 33.2 g/dL (ref 30.0–36.0)
MCV: 91 fL (ref 80.0–100.0)
Platelets: 235 10*3/uL (ref 150–400)
RBC: 4.9 MIL/uL (ref 4.22–5.81)
RDW: 12.1 % (ref 11.5–15.5)
WBC: 8.6 10*3/uL (ref 4.0–10.5)
nRBC: 0 % (ref 0.0–0.2)

## 2022-03-21 LAB — URINALYSIS, COMPLETE (UACMP) WITH MICROSCOPIC
Bacteria, UA: NONE SEEN
Bilirubin Urine: NEGATIVE
Glucose, UA: NEGATIVE mg/dL
Ketones, ur: NEGATIVE mg/dL
Leukocytes,Ua: NEGATIVE
Nitrite: NEGATIVE
Protein, ur: 30 mg/dL — AB
RBC / HPF: >50 RBC/hpf — ABNORMAL HIGH (ref 0–5)
Specific Gravity, Urine: 1.023 (ref 1.005–1.030)
pH: 5 (ref 5.0–8.0)

## 2022-03-21 LAB — HIV ANTIBODY (ROUTINE TESTING W REFLEX): HIV Screen 4th Generation wRfx: NONREACTIVE

## 2022-03-21 LAB — MRSA NEXT GEN BY PCR, NASAL: MRSA by PCR Next Gen: NOT DETECTED

## 2022-03-21 LAB — GLUCOSE, CAPILLARY: Glucose-Capillary: 136 mg/dL — ABNORMAL HIGH (ref 70–99)

## 2022-03-21 LAB — STREP PNEUMONIAE URINARY ANTIGEN: Strep Pneumo Urinary Antigen: NEGATIVE

## 2022-03-21 LAB — TROPONIN I (HIGH SENSITIVITY): Troponin I (High Sensitivity): 8 ng/L (ref <18)

## 2022-03-21 MED ORDER — FENTANYL 2500MCG IN NS 250ML (10MCG/ML) PREMIX INFUSION: 25.0000 ug/h | INTRAVENOUS | 0 refills | Status: DC

## 2022-03-21 MED ORDER — HYDROCOD POLI-CHLORPHE POLI ER 10-8 MG/5ML PO SUER: 5.0000 mL | Freq: Two times a day (BID) | ORAL | Status: DC | PRN

## 2022-03-21 MED ORDER — GUAIFENESIN ER 600 MG PO TB12: 600.0000 mg | ORAL_TABLET | Freq: Two times a day (BID) | ORAL | Status: DC

## 2022-03-21 MED ORDER — CHLORHEXIDINE GLUCONATE CLOTH 2 % EX PADS: 6.0000 | MEDICATED_PAD | Freq: Every day | CUTANEOUS | Status: DC

## 2022-03-21 MED ORDER — BUDESONIDE 0.5 MG/2ML IN SUSP
0.5000 mg | Freq: Two times a day (BID) | RESPIRATORY_TRACT | Status: DC
  Administered 2022-03-21: 0.5 mg via RESPIRATORY_TRACT
  Filled 2022-03-21: qty 2

## 2022-03-21 MED ORDER — ORAL CARE MOUTH RINSE
15.0000 mL | OROMUCOSAL | Status: DC | PRN
  Administered 2022-03-21: 15 mL via OROMUCOSAL

## 2022-03-21 MED ORDER — ARFORMOTEROL TARTRATE 15 MCG/2ML IN NEBU
15.0000 ug | INHALATION_SOLUTION | Freq: Two times a day (BID) | RESPIRATORY_TRACT | Status: DC
  Administered 2022-03-21 – 2022-03-27 (×12): 15 ug via RESPIRATORY_TRACT
  Filled 2022-03-21 (×13): qty 2

## 2022-03-21 MED ORDER — FENTANYL 2500MCG IN NS 250ML (10MCG/ML) PREMIX INFUSION
25.0000 ug/h | INTRAVENOUS | Status: DC
  Administered 2022-03-21: 125 ug/h via INTRAVENOUS

## 2022-03-21 MED ORDER — IPRATROPIUM-ALBUTEROL 0.5-2.5 (3) MG/3ML IN SOLN: 3.0000 mL | RESPIRATORY_TRACT | Status: DC

## 2022-03-21 MED ORDER — FENTANYL 2500MCG IN NS 250ML (10MCG/ML) PREMIX INFUSION
25.0000 ug/h | INTRAVENOUS | Status: DC
  Administered 2022-03-21: 50 ug/h via INTRAVENOUS
  Administered 2022-03-21: 25 ug/h via INTRAVENOUS
  Filled 2022-03-21: qty 250

## 2022-03-21 MED ORDER — PROPOFOL 1000 MG/100ML IV EMUL: 5.0000 ug/kg/min | INTRAVENOUS | Status: DC

## 2022-03-21 MED ORDER — METHYLPREDNISOLONE SODIUM SUCC 125 MG IJ SOLR
60.0000 mg | Freq: Two times a day (BID) | INTRAMUSCULAR | Status: DC
  Administered 2022-03-21: 60 mg via INTRAVENOUS
  Filled 2022-03-21: qty 2

## 2022-03-21 MED ORDER — PANTOPRAZOLE SODIUM 40 MG IV SOLR
40.0000 mg | Freq: Every day | INTRAVENOUS | Status: DC
  Administered 2022-03-21: 40 mg via INTRAVENOUS
  Filled 2022-03-21: qty 10

## 2022-03-21 MED ORDER — ENOXAPARIN SODIUM 40 MG/0.4ML IJ SOSY: 40.0000 mg | PREFILLED_SYRINGE | INTRAMUSCULAR | Status: DC

## 2022-03-21 MED ORDER — MAGNESIUM HYDROXIDE 400 MG/5ML PO SUSP: 30.0000 mL | Freq: Every day | ORAL | 0 refills | Status: DC | PRN

## 2022-03-21 MED ORDER — KETAMINE HCL 10 MG/ML IJ SOLN
INTRAMUSCULAR | Status: AC
  Filled 2022-03-21: qty 1

## 2022-03-21 MED ORDER — IPRATROPIUM-ALBUTEROL 0.5-2.5 (3) MG/3ML IN SOLN
3.0000 mL | RESPIRATORY_TRACT | Status: DC
  Administered 2022-03-21 (×2): 3 mL via RESPIRATORY_TRACT
  Filled 2022-03-21 (×2): qty 3

## 2022-03-21 MED ORDER — POLYETHYLENE GLYCOL 3350 17 G PO PACK: 17.0000 g | PACK | Freq: Every day | ORAL | Status: DC | PRN

## 2022-03-21 MED ORDER — HYDROCOD POLI-CHLORPHE POLI ER 10-8 MG/5ML PO SUER: 5.0000 mL | Freq: Two times a day (BID) | ORAL | 0 refills | Status: DC | PRN

## 2022-03-21 MED ORDER — POLYETHYLENE GLYCOL 3350 17 G PO PACK
17.0000 g | PACK | Freq: Every day | ORAL | Status: DC
  Administered 2022-03-22: 17 g
  Filled 2022-03-21 (×2): qty 1

## 2022-03-21 MED ORDER — DOCUSATE SODIUM 50 MG/5ML PO LIQD: 100.0000 mg | Freq: Two times a day (BID) | ORAL | Status: DC

## 2022-03-21 MED ORDER — PROPOFOL 1000 MG/100ML IV EMUL
5.0000 ug/kg/min | INTRAVENOUS | Status: DC
  Administered 2022-03-21: 5 ug/kg/min via INTRAVENOUS
  Filled 2022-03-21: qty 100

## 2022-03-21 MED ORDER — MIDAZOLAM HCL 2 MG/2ML IJ SOLN
1.0000 mg | INTRAMUSCULAR | Status: DC | PRN
  Administered 2022-03-21: 2 mg via INTRAVENOUS
  Filled 2022-03-21: qty 2

## 2022-03-21 MED ORDER — DOCUSATE SODIUM 100 MG PO CAPS: 100.0000 mg | ORAL_CAPSULE | Freq: Two times a day (BID) | ORAL | Status: DC | PRN

## 2022-03-21 MED ORDER — ACETAMINOPHEN 325 MG PO TABS: 650.0000 mg | ORAL_TABLET | Freq: Four times a day (QID) | ORAL | Status: DC | PRN

## 2022-03-21 MED ORDER — SODIUM CHLORIDE 0.9 % IV SOLN: 100.0000 mL | INTRAVENOUS | 0 refills | Status: DC

## 2022-03-21 MED ORDER — TRAZODONE HCL 50 MG PO TABS: 25.0000 mg | ORAL_TABLET | Freq: Every evening | ORAL | Status: DC | PRN

## 2022-03-21 MED ORDER — LEVOFLOXACIN IN D5W 750 MG/150ML IV SOLN: 750.0000 mg | INTRAVENOUS | Status: DC

## 2022-03-21 MED ORDER — FENTANYL BOLUS VIA INFUSION
25.0000 ug | INTRAVENOUS | Status: DC | PRN
  Administered 2022-03-21: 25 ug via INTRAVENOUS
  Administered 2022-03-21 (×2): 50 ug via INTRAVENOUS

## 2022-03-21 MED ORDER — FENTANYL BOLUS VIA INFUSION: 25.0000 ug | INTRAVENOUS | 0 refills | Status: DC | PRN

## 2022-03-21 MED ORDER — REVEFENACIN 175 MCG/3ML IN SOLN
175.0000 ug | Freq: Every day | RESPIRATORY_TRACT | Status: DC
  Administered 2022-03-23 – 2022-03-27 (×4): 175 ug via RESPIRATORY_TRACT
  Filled 2022-03-21 (×6): qty 3

## 2022-03-21 MED ORDER — HEPARIN SODIUM (PORCINE) 5000 UNIT/ML IJ SOLN
5000.0000 [IU] | Freq: Three times a day (TID) | INTRAMUSCULAR | Status: DC
  Administered 2022-03-21 – 2022-03-27 (×17): 5000 [IU] via SUBCUTANEOUS
  Filled 2022-03-21 (×16): qty 1

## 2022-03-21 MED ORDER — DOCUSATE SODIUM 50 MG/5ML PO LIQD: 100.0000 mg | Freq: Two times a day (BID) | ORAL | 0 refills | Status: DC

## 2022-03-21 MED ORDER — FENTANYL BOLUS VIA INFUSION
25.0000 ug | INTRAVENOUS | Status: DC | PRN
  Administered 2022-03-22 – 2022-03-23 (×3): 50 ug via INTRAVENOUS

## 2022-03-21 MED ORDER — ALBUTEROL SULFATE (2.5 MG/3ML) 0.083% IN NEBU
2.5000 mg | INHALATION_SOLUTION | RESPIRATORY_TRACT | Status: DC | PRN
  Administered 2022-03-24 – 2022-03-27 (×3): 2.5 mg via RESPIRATORY_TRACT
  Filled 2022-03-21 (×3): qty 3

## 2022-03-21 MED ORDER — DOCUSATE SODIUM 50 MG/5ML PO LIQD
100.0000 mg | Freq: Two times a day (BID) | ORAL | Status: DC
  Administered 2022-03-22 – 2022-03-26 (×7): 100 mg
  Filled 2022-03-21 (×13): qty 10

## 2022-03-21 MED ORDER — FAMOTIDINE 20 MG PO TABS
20.0000 mg | ORAL_TABLET | Freq: Two times a day (BID) | ORAL | Status: DC
  Administered 2022-03-22 – 2022-03-27 (×11): 20 mg
  Filled 2022-03-21 (×12): qty 1

## 2022-03-21 MED ORDER — PANTOPRAZOLE SODIUM 40 MG IV SOLR: 40.0000 mg | Freq: Every day | INTRAVENOUS | Status: DC

## 2022-03-21 MED ORDER — BUDESONIDE 0.5 MG/2ML IN SUSP
0.5000 mg | Freq: Two times a day (BID) | RESPIRATORY_TRACT | Status: DC
  Administered 2022-03-21 – 2022-03-27 (×12): 0.5 mg via RESPIRATORY_TRACT
  Filled 2022-03-21 (×12): qty 2

## 2022-03-21 MED ORDER — ONDANSETRON HCL 4 MG PO TABS: 4.0000 mg | ORAL_TABLET | Freq: Four times a day (QID) | ORAL | 0 refills | Status: DC | PRN

## 2022-03-21 MED ORDER — MIDAZOLAM HCL 2 MG/2ML IJ SOLN: 1.0000 mg | INTRAMUSCULAR | 0 refills | Status: DC | PRN

## 2022-03-21 MED ORDER — POLYETHYLENE GLYCOL 3350 17 G PO PACK: 17.0000 g | PACK | Freq: Every day | ORAL | 0 refills | Status: DC

## 2022-03-21 MED ORDER — METHYLPREDNISOLONE SODIUM SUCC 125 MG IJ SOLR: 60.0000 mg | Freq: Two times a day (BID) | INTRAMUSCULAR | 0 refills | Status: DC

## 2022-03-21 MED ORDER — LACTATED RINGERS IV BOLUS
1000.0000 mL | Freq: Once | INTRAVENOUS | Status: AC
  Administered 2022-03-21: 1000 mL via INTRAVENOUS

## 2022-03-21 MED ORDER — PROPOFOL 1000 MG/100ML IV EMUL
INTRAVENOUS | Status: AC
  Filled 2022-03-21: qty 100

## 2022-03-21 MED ORDER — POLYETHYLENE GLYCOL 3350 17 G PO PACK: 17.0000 g | PACK | Freq: Every day | ORAL | Status: DC

## 2022-03-21 MED ORDER — FENTANYL CITRATE PF 50 MCG/ML IJ SOSY: 25.0000 ug | PREFILLED_SYRINGE | Freq: Once | INTRAMUSCULAR | Status: DC

## 2022-03-21 MED ORDER — GUAIFENESIN ER 600 MG PO TB12
600.0000 mg | ORAL_TABLET | Freq: Two times a day (BID) | ORAL | Status: DC
  Administered 2022-03-21 (×2): 600 mg via ORAL
  Filled 2022-03-21 (×2): qty 1

## 2022-03-21 MED ORDER — METHYLPREDNISOLONE SODIUM SUCC 40 MG IJ SOLR
40.0000 mg | Freq: Two times a day (BID) | INTRAMUSCULAR | Status: DC
  Administered 2022-03-21: 40 mg via INTRAVENOUS
  Filled 2022-03-21: qty 1

## 2022-03-21 MED ORDER — BUDESONIDE 0.5 MG/2ML IN SUSP: 0.5000 mg | Freq: Two times a day (BID) | RESPIRATORY_TRACT | 12 refills | Status: DC

## 2022-03-21 MED ORDER — ROCURONIUM BROMIDE 10 MG/ML (PF) SYRINGE
PREFILLED_SYRINGE | INTRAVENOUS | Status: AC
  Filled 2022-03-21: qty 10

## 2022-03-21 MED ORDER — PROPOFOL 1000 MG/100ML IV EMUL
0.0000 ug/kg/min | INTRAVENOUS | Status: DC
  Administered 2022-03-21 – 2022-03-22 (×3): 40 ug/kg/min via INTRAVENOUS
  Filled 2022-03-21 (×2): qty 100

## 2022-03-21 MED ORDER — ORAL CARE MOUTH RINSE
15.0000 mL | OROMUCOSAL | Status: DC
  Administered 2022-03-22 – 2022-03-23 (×17): 15 mL via OROMUCOSAL

## 2022-03-21 NOTE — ED Notes (Signed)
80 rocc given at this time.

## 2022-03-21 NOTE — H&P (Incomplete)
NAME:  Frederick Brewer, MRN:  [id number], DOB:  [birth year], LOS: 0 ADMISSION DATE:  03/21/2022, CONSULTATION DATE:  03/21/2022  REFERRING MD:  [facility] transfer, CHIEF COMPLAINT:  ***   History of Present Illness:  71 y.o. male admitted with Acute Hypoxic & Hypercapnic Respiratory Failure in the setting of Acute COPD Exacerbation due to RSV infection. Failed trial of BiPAP requiring intubation and mechanical ventilation  He was transferred from [facility] ED since no beds were available to admit there. He was admitted by the PCCM team there, initial VBG on BiPAP was 7.28/80 He was started on IV Solu-Medrol 60 every 12 and empiric Levaquin.  He was sedated with propofol and fentanyl.  Chest x-ray did not show any infiltrates   Pertinent  Medical History  ***  Significant Hospital Events: Including procedures, antibiotic start and stop dates in addition to other pertinent events   12/29: Admitted by Hospitalist, requiring BiPAP. 12/30: Worsening acute respiratory distress and hypoxia (sats in 70's) despite BiPAP, requiring emergent intubation.  PCCM consulted.    Interim History / Subjective:  Transferred to [facility] ICU. Sedated on propofol and fentanyl  Objective   There were no vitals taken for this visit.    Vent Mode: PRVC FiO2 (%):  [40 %-60 %] 40 % Set Rate:  [18 bmp-26 bmp] 18 bmp Vt Set:  [450 mL-600 mL] 600 mL PEEP:  [5 cmH20] 5 cmH20 Plateau Pressure:  [18 cmH20] 18 cmH20   Intake/Output Summary (Last 24 hours) at 03/21/2022 2323 Last data filed at 03/21/2022 2200 Gross per 24 hour  Intake --  Output 100 ml  Net -100 ml   There were no vitals filed for this visit.  Examination: General: Intubated and sedated, no distress, elderly man HENT: Oral ETT, supple neck, no JVD Lungs: Decreased breath sounds bilateral, synchronous, no accessory muscle use Cardiovascular: S1-S2 tacky, no murmurs Abdomen: Soft, nontender, no guarding Extremities: No deformity, no edema Neuro:  Sedate, RASS -2 GU: Foley, clear urine  Labs show mild hyponatremia, no leukocytosis, negative procalcitonin ABG shows partially compensated respiratory acidosis  Resolved Hospital Problem list     Assessment & Plan:  Acute hypoxic and hypercarbic respiratory failure. Acute exacerbation of COPD. RSV infection  -Vent settings reviewed and adjusted -IV Solu-Medrol 60 every 12 -Use budesonide, Brovana and Yupelri nebs with albuterol for breakthrough -Spontaneous breathing trials once bronchospasm resolves with goal extubation -Will DC Levaquin  Hyponatremia -follow BMET  Best Practice (right click and "Reselect all SmartList Selections" daily)   Diet/type: NPO w/ meds via tube DVT prophylaxis: prophylactic heparin  GI prophylaxis: PPI Lines: N/A Foley:  Yes, and it is still needed Code Status:  full code Last date of multidisciplinary goals of care discussion [NA]  Labs   CBC: Recent Labs  Lab 03/20/22 2248 03/21/22 0416  WBC 9.4 8.6  NEUTROABS 5.9  --   HGB 15.5 14.8  HCT 48.5 44.6  MCV 91.7 91.0  PLT 251 235    Basic Metabolic Panel: Recent Labs  Lab 03/20/22 2248 03/21/22 0416  NA 135 133*  K 4.5 4.2  CL 92* 92*  CO2 33* 33*  GLUCOSE 179* 150*  BUN 15 14  CREATININE 0.78 0.70  CALCIUM 9.2 8.7*   GFR: Estimated Creatinine Clearance: 91.5 mL/min (by C-G formula based on SCr of 0.7 mg/dL). Recent Labs  Lab 03/20/22 2248 03/21/22 0416 03/21/22 1428  PROCALCITON  --   --  <0.10  WBC 9.4 8.6  --  Liver Function Tests: Recent Labs  Lab 03/20/22 2248  AST 36  ALT 26  ALKPHOS 68  BILITOT 0.7  PROT 7.9  ALBUMIN 4.1   No results for input(s): "LIPASE", "AMYLASE" in the last 168 hours. No results for input(s): "AMMONIA" in the last 168 hours.  ABG    Component Value Date/Time   PHART 7.29 (L) 03/21/2022 1757   PCO2ART 79 (HH) 03/21/2022 1757   PO2ART 89 03/21/2022 1757   HCO3 38.0 (H) 03/21/2022 1757   O2SAT 98.4 03/21/2022 1757      Coagulation Profile: No results for input(s): "INR", "PROTIME" in the last 168 hours.  Cardiac Enzymes: No results for input(s): "CKTOTAL", "CKMB", "CKMBINDEX", "TROPONINI" in the last 168 hours.  HbA1C: No results found for: "HGBA1C"  CBG: No results for input(s): "GLUCAP" in the last 168 hours.  Review of Systems:   Unable to obtain  Past Medical History:  He,  has a past medical history of COPD (chronic obstructive pulmonary disease) (HCC).   Surgical History:  No past surgical history on file.   Social History:   reports that he has been smoking cigarettes. He has been smoking an average of 1 pack per day. He has never used smokeless tobacco.   Family History:  His family history is not on file.   Allergies Allergies  Allergen Reactions   Penicillins      Home Medications  Prior to Admission medications   Medication Sig Start Date End Date Taking? Authorizing Provider  acetaminophen (TYLENOL) 325 MG tablet Take 2 tablets (650 mg total) by mouth every 6 (six) hours as needed for mild pain (or Fever >/= 101). 03/21/22   Judithe Modest, NP  budesonide (PULMICORT) 0.5 MG/2ML nebulizer solution Take 2 mLs (0.5 mg total) by nebulization 2 (two) times daily. 03/21/22   Judithe Modest, NP  chlorpheniramine-HYDROcodone (TUSSIONEX) 10-8 MG/5ML Take 5 mLs by mouth every 12 (twelve) hours as needed for cough. 03/21/22   Judithe Modest, NP  docusate (COLACE) 50 MG/5ML liquid Place 10 mLs (100 mg total) into feeding tube 2 (two) times daily. 03/21/22   Judithe Modest, NP  enoxaparin (LOVENOX) 40 MG/0.4ML injection Inject 0.4 mLs (40 mg total) into the skin daily. 03/22/22   Judithe Modest, NP  fentaNYL (SUBLIMAZE) SOLN Inject 25-100 mcg into the vein every 15 (fifteen) minutes as needed (to maintain RASS & CPOT goal.). 03/21/22   Harlon Ditty D, NP  fentaNYL 10 mcg/ml SOLN infusion Inject 25-200 mcg/hr into the vein continuous. 03/21/22   Judithe Modest, NP   guaiFENesin (MUCINEX) 600 MG 12 hr tablet Take 1 tablet (600 mg total) by mouth 2 (two) times daily. 03/21/22   Harlon Ditty D, NP  ipratropium-albuterol (DUONEB) 0.5-2.5 (3) MG/3ML SOLN Take 3 mLs by nebulization every 4 (four) hours. 03/21/22   Judithe Modest, NP  levofloxacin (LEVAQUIN) 750 MG/150ML SOLN Inject 150 mLs (750 mg total) into the vein daily. 03/22/22   Judithe Modest, NP  magnesium hydroxide (MILK OF MAGNESIA) 400 MG/5ML suspension Take 30 mLs by mouth daily as needed for mild constipation. 03/21/22   Judithe Modest, NP  methylPREDNISolone sodium succinate (SOLU-MEDROL) 125 mg/2 mL injection Inject 0.96 mLs (60 mg total) into the vein every 12 (twelve) hours. 03/21/22   Judithe Modest, NP  midazolam (VERSED) 2 MG/2ML SOLN injection Inject 1-2 mLs (1-2 mg total) into the vein every hour as needed for sedation (to maintain RASS goal.). 03/21/22  Judithe Modest, NP  ondansetron (ZOFRAN) 4 MG tablet Take 1 tablet (4 mg total) by mouth every 6 (six) hours as needed for nausea. 03/21/22   Judithe Modest, NP  pantoprazole (PROTONIX) 40 MG injection Inject 40 mg into the vein daily. 03/22/22   Judithe Modest, NP  polyethylene glycol (MIRALAX / GLYCOLAX) 17 g packet Place 17 g into feeding tube daily. 03/22/22   Harlon Ditty D, NP  propofol (DIPRIVAN) 1000 MG/100ML EMUL injection Inject 379-6,064 mcg/min into the vein continuous. 03/21/22   Harlon Ditty D, NP  sodium chloride 0.9 % infusion Inject 100 mLs into the vein continuous. 03/21/22   Judithe Modest, NP  traZODone (DESYREL) 50 MG tablet Take 0.5 tablets (25 mg total) by mouth at bedtime as needed for sleep. 03/21/22   Judithe Modest, NP     Critical care time: 83m       Cyril Mourning MD. [facility]. [facility] [facility] Pager : [phone]  If no response to pager , please call [phone] until 7 pm After 7:00 pm call Elink  [phone]   03/21/2022

## 2022-03-21 NOTE — ED Notes (Addendum)
Pt's friend Pilar Plate at bedside. Updated concerning transfer to [facility] and approved by friend. Margit Banda stated friend made him "power of attorney or something". Mr Jackolyn Confer does not have access to papers at this moment.

## 2022-03-21 NOTE — ED Notes (Signed)
Pt's friend Epperson called and updated of room status at [facility] and that he is waiting for transport.

## 2022-03-21 NOTE — Discharge Summary (Signed)
Physician Discharge Summary  Patient ID: Frederick Brewer MRN: [id number] DOB/AGE: September 11, 1950 71 y.o.  Admit date: 03/20/2022 Discharge date: 03/21/2022   Brief Pt Description / Synopsis:  71 y.o. male admitted with Acute Hypoxic & Hypercapnic Respiratory Failure in the setting of Acute COPD Exacerbation due to RSV infection. Failed trial of BiPAP requiring intubation and mechanical ventilation.   Discharge Diagnoses:   Acute Hypoxic & Hypercapnic Respiratory Failure Acute COPD Exacerbation  RSV Viral infection Mild Hyponatremia                                                           Discharge Summary:   Frederick Brewer is a 71 year old male with a past medical history significant for COPD who presented to [facility] ED on 03/20/2022 due to acute onset of worsening dyspnea with associated productive cough of greenish/yellowish sputum, and wheezing.  Patient is currently intubated and sedated and no family is present, therefore history is obtained from chart review.   Per ED and nursing notes, in addition to the acute onset of dyspnea, productive cough, and wheezing, the patient also endorsed left lower quadrant abdominal pain that he felt was related to a pulled muscle due to severe coughing.  He denies chest pain, palpitations, nausea, vomiting, diarrhea, dysuria, melena, hematochezia.   Given his significant respiratory distress, he was placed on CPAP by EMS, and then transitioned to BiPAP upon arrival in the ED   ED Course: Initial Vital Signs: Blood pressure 164/79, pulse 101, respiratory rate 24, SpO2 99% on BiPAP (40% FiO2), Temperature 97.6 F axillary Significant Labs: Chloride 92, bicarb 33, glucose 179, high-sensitivity troponin 6, WBC 9.4 VBG on BiPAP: pH 7.28/pCO2 80/pCO2 36/bicarb 37.6 Imaging Chest X-ray>>IMPRESSION: Advanced emphysematous changes without evidence of acute cardiopulmonary process. Medications Administered: 2 DuoNebs, IV Solu-Medrol and IV magnesium by EMS and 1  DuoNeb here    Hospitalist were asked to admit the patient for further workup and treatment.   Please see "significant hospital events" section below for full detailed hospital course.  Discharge Plan by Diagnosis:   #Acute Hypoxic & Hypercapnic Respiratory Failure in setting of AECOPD due to RSV infection PMHx: COPD, tobacco abuse -Full vent support, implement lung protective strategies -Plateau pressures less than 30 cm H20 -Wean FiO2 & PEEP as tolerated to maintain O2 sats 88 to 92%% -Follow intermittent Chest X-ray & ABG as needed -Spontaneous Breathing Trials when respiratory parameters met and mental status permits -Implement VAP Bundle -Bronchodilators & Pulmicort nebs -IV steroids -ABX as above   #Met SIRS Criteria at admission (HR 101, RR 24) #RSV Infection -Monitor fever curve -Trend WBC's & Procalcitonin to rule out superimposed bacterial pneumonia -Follow cultures as above -Continue empiric Levaquin pending cultures & sensitivities   #Mild Hyponatremia -Monitor I&O's / urinary output -Follow BMP -Ensure adequate renal perfusion -Avoid nephrotoxic agents as able -Replace electrolytes as indicated -IV fluids   #Sedation needs in the setting of mechanical ventilation -Maintain a RASS goal of 0 to -1 -Fentanyl and Propofol as needed to maintain RASS goal -Avoid sedating medications as able -Daily wake up assessment   Significant Events:  12/29: Admitted by Hospitalist, requiring BiPAP. 12/30: Worsening acute respiratory distress and hypoxia (sats in 70's) despite BiPAP, requiring emergent intubation.  PCCM consulted.  Significant Diagnostic Studies:  12/29: CXR>>IMPRESSION: Advanced emphysematous changes without evidence of acute cardiopulmonary process. 12/30: CXR>>IMPRESSION: 1. Endotracheal tube in satisfactory position. 2. Stable changes of COPD. 3. No acute abnormality.            Micro Data:  12/29: SARS-CoV-2 and influenza PCR>>  negative 12/29: + RSV 12/30: HIV screen>>negative 12/30: Strep pneumo urinary antigen>> 12/30: Legionella urinary antigen>> 12/30: Tracheal aspirate>>  Antimicrobials:   Anti-infectives (From admission, onward)    Start     Dose/Rate Route Frequency Ordered Stop   03/22/22 0000  levofloxacin (LEVAQUIN) 750 MG/150ML SOLN        750 mg Intravenous Every 24 hours 03/21/22 1529     03/21/22 0000  levofloxacin (LEVAQUIN) IVPB 750 mg        750 mg 100 mL/hr over 90 Minutes Intravenous Every 24 hours 03/20/22 2351 03/25/22 2359         Consults:  PCCM   Discharge Exam:  General: Acute on chronically ill-appearing male, laying in bed, intubated and sedated, no acute distress HENT: Atraumatic, normocephalic, neck supple, no JVD Lungs: Very diminished breath sounds throughout, synchronous with the vent, even, nonlabored Cardiovascular: Tachycardia, regular rhythm, S1-S2, no murmurs, rubs, gallops Abdomen: Soft, nontender, nondistended, no guarding rebound tenderness, bowel sounds positive x 4 Extremities: Normal bulk and tone, no deformities, no edema, no cyanosis Neuro: Heavily sedated following intubation, currently does not follow commands, pupils PERRLA GU: Foley catheter currently being placed by nursing   Vitals:   03/21/22 1435 03/21/22 1440 03/21/22 1445 03/21/22 1500  BP: (!) 191/90 (!) 195/89 (!) 193/91 (!) 142/84  Pulse: (!) 105 (!) 103 (!) 106 96  Resp: 20 (!) 24 (!) 32 20  Temp: 98 F (36.7 C) 98 F (36.7 C) 98 F (36.7 C) 97.9 F (36.6 C)  TempSrc:      SpO2: 94% 97% 97% 95%  Weight:      Height:         Discharge Labs:   BMET Recent Labs  Lab 03/20/22 2248 03/21/22 0416  NA 135 133*  K 4.5 4.2  CL 92* 92*  CO2 33* 33*  GLUCOSE 179* 150*  BUN 15 14  CREATININE 0.78 0.70  CALCIUM 9.2 8.7*    CBC Recent Labs  Lab 03/20/22 2248 03/21/22 0416  HGB 15.5 14.8  HCT 48.5 44.6  WBC 9.4 8.6  PLT 251 235    Anti-Coagulation No results for  input(s): "INR" in the last 168 hours.        Allergies as of 03/21/2022       Reactions   Penicillins         Medication List     STOP taking these medications    meclizine 25 MG tablet Commonly known as: ANTIVERT       TAKE these medications    acetaminophen 325 MG tablet Commonly known as: TYLENOL Take 2 tablets (650 mg total) by mouth every 6 (six) hours as needed for mild pain (or Fever >/= 101).   budesonide 0.5 MG/2ML nebulizer solution Commonly known as: PULMICORT Take 2 mLs (0.5 mg total) by nebulization 2 (two) times daily.   chlorpheniramine-HYDROcodone 10-8 MG/5ML Commonly known as: TUSSIONEX Take 5 mLs by mouth every 12 (twelve) hours as needed for cough.   docusate 50 MG/5ML liquid Commonly known as: COLACE Place 10 mLs (100 mg total) into feeding tube 2 (two) times daily.   enoxaparin 40 MG/0.4ML injection Commonly known as: LOVENOX Inject 0.4 mLs (  40 mg total) into the skin daily. Start taking on: March 22, 2022   fentaNYL 10 mcg/ml Soln infusion Inject 25-200 mcg/hr into the vein continuous.   fentaNYL Soln Commonly known as: SUBLIMAZE Inject 25-100 mcg into the vein every 15 (fifteen) minutes as needed (to maintain RASS & CPOT goal.).   guaiFENesin 600 MG 12 hr tablet Commonly known as: MUCINEX Take 1 tablet (600 mg total) by mouth 2 (two) times daily.   ipratropium-albuterol 0.5-2.5 (3) MG/3ML Soln Commonly known as: DUONEB Take 3 mLs by nebulization every 4 (four) hours.   levofloxacin 750 MG/150ML Soln Commonly known as: LEVAQUIN Inject 150 mLs (750 mg total) into the vein daily. Start taking on: March 22, 2022   magnesium hydroxide 400 MG/5ML suspension Commonly known as: MILK OF MAGNESIA Take 30 mLs by mouth daily as needed for mild constipation.   methylPREDNISolone sodium succinate 125 mg/2 mL injection Commonly known as: SOLU-MEDROL Inject 0.96 mLs (60 mg total) into the vein every 12 (twelve) hours.    midazolam 2 MG/2ML Soln injection Commonly known as: VERSED Inject 1-2 mLs (1-2 mg total) into the vein every hour as needed for sedation (to maintain RASS goal.).   ondansetron 4 MG tablet Commonly known as: ZOFRAN Take 1 tablet (4 mg total) by mouth every 6 (six) hours as needed for nausea.   pantoprazole 40 MG injection Commonly known as: PROTONIX Inject 40 mg into the vein daily. Start taking on: March 22, 2022   polyethylene glycol 17 g packet Commonly known as: MIRALAX / GLYCOLAX Place 17 g into feeding tube daily. Start taking on: March 22, 2022   propofol 1000 MG/100ML Emul injection Commonly known as: DIPRIVAN Inject 379-6,064 mcg/min into the vein continuous.   sodium chloride 0.9 % infusion Inject 100 mLs into the vein continuous.   traZODone 50 MG tablet Commonly known as: DESYREL Take 0.5 tablets (25 mg total) by mouth at bedtime as needed for sleep.            Disposition: ICU  Time spent on disposition:  40 Minutes.     Signed: Darel Hong, AGACNP-BC [facility] Prefer epic messenger for cross cover needs If after hours, please call E-link

## 2022-03-21 NOTE — ED Notes (Signed)
100 ketamine given at this time.

## 2022-03-21 NOTE — Assessment & Plan Note (Addendum)
-   This is likely secondary to acute bronchitis. - The patient will be placed on nebulized DuoNebs 4 times daily and every 4 hours as needed. - Mucolytic therapy will be provided. - We will start him on IV antibiotic therapy with IV Levaquin given excessive purulent expectoration.

## 2022-03-21 NOTE — ED Notes (Signed)
Pt on NRB and meds at bedside.MD at bedside ready to intubate pt.

## 2022-03-21 NOTE — ED Notes (Addendum)
Frederick Brewer at bedside. Requested to be updated for any changes. Frederick Brewer, took pt belongings of two bags which contained pt's clothes, phone, charger, keys, and upper palate.

## 2022-03-21 NOTE — ED Notes (Signed)
Pt being bagged at this time.

## 2022-03-21 NOTE — Progress Notes (Signed)
RT obtained ABG on 8cc VT due to orders, Other orders were placed after previous orders. ABG was done on 8cc. After RT obtained ABG RT placed patient on 6cc per new orders.

## 2022-03-21 NOTE — ED Notes (Signed)
MD Stafford tubing pt at this time. Tube in place and color change. 24 at the lip.

## 2022-03-21 NOTE — ED Notes (Signed)
This RN called to room by RT. Pt limbs twitching. Given fentanyl bolus. RT noticed OG tube displaced at 59 CM. RN ordered repeat chest xray, RT unable to advance OG tube to original placement

## 2022-03-21 NOTE — Progress Notes (Signed)
[facility]  - [place] [facility]   PATIENT NAME: Frederick Brewer    MR#:  [id number]  DATE OF BIRTH:  January 10, 1951  SUBJECTIVE:   Patient struggling to breathe. He is using accessory muscles unable to catch his air and sats dropping down in the 70s went on in the emergency room. No family at bedside. Patient diagnosed of RSV infection. Discussed with patient regarding need for mechanical ventilator given significant hypoxia and not tolerating BiPAP earlier. Understands risk and complication and agrees to it.   VITALS:  Blood pressure (!) 146/68, pulse 79, temperature 97.6 F (36.4 C), temperature source Axillary, resp. rate (!) 24, height 5\' 11"  (1.803 m), weight 75.8 kg, SpO2 95 %.  PHYSICAL EXAMINATION:   GENERAL:  71 y.o.-year-old patient lying in the bed with severe acute distress.  LUNGS: severe bronchospasm. Use of accessory muscles CARDIOVASCULAR: S1, S2 normal. No murmur tachycardia ABDOMEN: Soft, nontender, nondistended. Bowel sounds present.  EXTREMITIES: No  edema b/l.    NEUROLOGIC: nonfocal  patient is alert and awake SKIN: No obvious rash, lesion, or ulcer.   LABORATORY PANEL:  CBC Recent Labs  Lab 03/21/22 0416  WBC 8.6  HGB 14.8  HCT 44.6  PLT 235    Chemistries  Recent Labs  Lab 03/20/22 2248 03/21/22 0416  NA 135 133*  K 4.5 4.2  CL 92* 92*  CO2 33* 33*  GLUCOSE 179* 150*  BUN 15 14  CREATININE 0.78 0.70  CALCIUM 9.2 8.7*  AST 36  --   ALT 26  --   ALKPHOS 68  --   BILITOT 0.7  --    Cardiac Enzymes No results for input(s): "TROPONINI" in the last 168 hours. RADIOLOGY:  DG Chest Portable 1 View  Result Date: 03/20/2022 CLINICAL DATA:  Shortness of breath. EXAM: PORTABLE CHEST 1 VIEW COMPARISON:  CT chest dated December 12, 2010 FINDINGS: The heart size and mediastinal contours are within normal limits. Advanced emphysematous changes with large emphysematous bulla in the bilateral upper lobes. No evidence of pneumothorax.  No focal consolidation or large pleural effusion. The visualized skeletal structures are unremarkable. IMPRESSION: Advanced emphysematous changes without evidence of acute cardiopulmonary process. Electronically Signed   By: December 14, 2010 D.O.   On: 03/20/2022 23:12    Assessment and Plan   Frederick Brewer is a 71 y.o. male with medical history significant for COPD, who presented to the emergency room with acute onset of worsening dyspnea with associated cough productive of greenish and yellowish sputum as well as wheezing since Monday  patient was diagnosed with RSV virus infection.  Acute hypoxic hyper cardiac respiratory failure secondary to advanced severe emphysema COPD Ex heavy tobacco abuse RSV infection -- patient was placed on BiPAP area. Then change to nasal cannula on 5 L. Patient went into severe bronchospasm with use of accessory muscles. Sats drop down into the 70s. -- Case with ER physician to to Stat mechanical ventilator. Risk and complication discussed with patient. His friend Wednesday was on speakerphone. -- Patient will be intubated in the emergency room. -- Discussed with ICU attending Dr.Aleskerov to see patient and take on ICU service. -- IV Solu-Medrol, bronchodilator, nebulizer, empiric antibiotic  Hyperglycemia -- check A1c -- sliding scale insulin   Procedures: Family communication : patient's friend [name] aware Consults : PC CM CODE STATUS: full DVT Prophylaxis : Lovenox Level of care ICU Status is: Inpatient Remains inpatient appropriate because: severe respiratory distress requiring mechanical ventilator    TOTAL  critical TIME TAKING CARE OF THIS PATIENT: 45 minutes.  >50% time spent on counselling and coordination of care  Note: This dictation was prepared with Dragon dictation along with smaller phrase technology. Any transcriptional errors that result from this process are unintentional.  Enedina Finner M.D    [facility]   CC: Primary care  physician; Lauro Regulus, MD

## 2022-03-21 NOTE — Assessment & Plan Note (Addendum)
-   This is clearly secondary to COPD exacerbation. - The patient will be admitted to a progressive unit bed. - She will be continued on BiPAP. - We will follow ABGs. - O2 protocol will be followed.

## 2022-03-21 NOTE — ED Notes (Signed)
Warm blankets placed on pt and room temp increased for foley temp of 97.3

## 2022-03-21 NOTE — ED Notes (Signed)
Pt O2sat in mid-high 90s% on 2L [place]. Pt stood at bedside to use urinal and O2sat decreased to 87%. This RN increased O2 to 4L and O2 increased to 92%. Pt resting in bed now and this RN decreased O2 to 2L. O2sat high 90s

## 2022-03-21 NOTE — ED Notes (Signed)
EMTALA Reivewed by this RN.  

## 2022-03-21 NOTE — Consult Note (Signed)
NAME:  Frederick Brewer, MRN:  [id number], DOB:  1950/05/10, LOS: 1 ADMISSION DATE:  03/20/2022, CONSULTATION DATE:  03/21/2022 REFERRING MD:  Dr. Posey Pronto, CHIEF COMPLAINT:  Acute Respiratory Distress   Brief Pt Description / Synopsis:  71 y.o. male admitted with Acute Hypoxic & Hypercapnic Respiratory Failure in the setting of Acute COPD Exacerbation due to RSV infection.  Failed trial of BiPAP requiring intubation and mechanical ventilation.  History of Present Illness:  Frederick Brewer is a 71 year old male with a past medical history significant for COPD who presented to [facility] ED on 03/20/2022 due to acute onset of worsening dyspnea with associated productive cough of greenish/yellowish sputum, and wheezing.  Patient is currently intubated and sedated and no family is present, therefore history is obtained from chart review.  Per ED and nursing notes, in addition to the acute onset of dyspnea, productive cough, and wheezing, the patient also endorsed left lower quadrant abdominal pain that he felt was related to a pulled muscle due to severe coughing.  He denies chest pain, palpitations, nausea, vomiting, diarrhea, dysuria, melena, hematochezia.  Given his significant respiratory distress, he was placed on CPAP by EMS, and then transitioned to BiPAP upon arrival in the ED  ED Course: Initial Vital Signs: Blood pressure 164/79, pulse 101, respiratory rate 24, SpO2 99% on BiPAP (40% FiO2), Temperature 97.6 F axillary Significant Labs: Chloride 92, bicarb 33, glucose 179, high-sensitivity troponin 6, WBC 9.4 VBG on BiPAP: pH 7.28/pCO2 80/pCO2 36/bicarb 37.6 Imaging Chest X-ray>>IMPRESSION: Advanced emphysematous changes without evidence of acute cardiopulmonary process. Medications Administered: 2 DuoNebs, IV Solu-Medrol and IV magnesium by EMS and 1 DuoNeb here   Hospitalist were asked to admit the patient for further workup and treatment.  Please see "significant hospital events" section below  for full detailed hospital course.  Pertinent  Medical History   Past Medical History:  Diagnosis Date   COPD (chronic obstructive pulmonary disease) ([place])     Micro Data:  12/29: SARS-CoV-2 and influenza PCR>> negative 12/29: + RSV 12/30: HIV screen>>negative 12/30: Strep pneumo urinary antigen>> 12/30: Legionella urinary antigen>> 12/30: Tracheal aspirate>>   Antimicrobials:   Anti-infectives (From admission, onward)    Start     Dose/Rate Route Frequency Ordered Stop   03/21/22 0000  levofloxacin (LEVAQUIN) IVPB 750 mg        750 mg 100 mL/hr over 90 Minutes Intravenous Every 24 hours 03/20/22 2351 03/25/22 [place] Hospital Events: Including procedures, antibiotic start and stop dates in addition to other pertinent events   12/29: Admitted by Hospitalist, requiring BiPAP. 12/30: Worsening acute respiratory distress and hypoxia (sats in 70's) despite BiPAP, requiring emergent intubation.  PCCM consulted.  Interim History / Subjective:  -This morning while boarding in the ED, patient developed worsening acute respiratory distress and severe hypoxia with O2 sats dropping into the 70s despite BiPAP -Was emergently intubated by ED provider, PCCM now consulted to manage the vent -Currently sedated following intubation, hematologically stable, no pressors  Objective   Blood pressure (!) 146/68, pulse (!) 112, temperature 97.6 F (36.4 C), temperature source Axillary, resp. rate (!) 24, height _0  (1.803 m), weight 75.8 kg, SpO2 100 %.    FiO2 (%):  [40 %] 40 %   Intake/Output Summary (Last 24 hours) at 03/21/2022 1419 Last data filed at 03/21/2022 0937 Gross per 24 hour  Intake --  Output 300 ml  Net -300 ml   Filed Weights   03/20/22 2252  Weight: 75.8 kg    Examination: General: Acute on chronically ill-appearing male, laying in bed, intubated and sedated, no acute distress HENT: Atraumatic, normocephalic, neck supple, no JVD Lungs: Very  diminished breath sounds throughout, synchronous with the vent, even, nonlabored Cardiovascular: Tachycardia, regular rhythm, S1-S2, no murmurs, rubs, gallops Abdomen: Soft, nontender, nondistended, no guarding rebound tenderness, bowel sounds positive x 4 Extremities: Normal bulk and tone, no deformities, no edema, no cyanosis Neuro: Heavily sedated following intubation, currently does not follow commands, pupils PERRLA GU: Foley catheter currently being placed by nursing  Resolved Hospital Problem list     Assessment & Plan:   #Acute Hypoxic & Hypercapnic Respiratory Failure in setting of AECOPD due to RSV infection PMHx: COPD, tobacco abuse -Full vent support, implement lung protective strategies -Plateau pressures less than 30 cm H20 -Wean FiO2 & PEEP as tolerated to maintain O2 sats 88 to 92%% -Follow intermittent Chest X-ray & ABG as needed -Spontaneous Breathing Trials when respiratory parameters met and mental status permits -Implement VAP Bundle -Bronchodilators & Pulmicort nebs -IV steroids -ABX as above  #Met SIRS Criteria at admission (HR 101, RR 24) #RSV Infection -Monitor fever curve -Trend WBC's & Procalcitonin to rule out superimposed bacterial pneumonia -Follow cultures as above -Continue empiric Levaquin pending cultures & sensitivities  #Mild Hyponatremia -Monitor I&O's / urinary output -Follow BMP -Ensure adequate renal perfusion -Avoid nephrotoxic agents as able -Replace electrolytes as indicated -IV fluids  #Sedation needs in the setting of mechanical ventilation -Maintain a RASS goal of 0 to -1 -Fentanyl and Propofol as needed to maintain RASS goal -Avoid sedating medications as able -Daily wake up assessment   Best Practice (right click and "Reselect all SmartList Selections" daily)   Diet/type: NPO DVT prophylaxis: LMWH GI prophylaxis: PPI Lines: N/A Foley:  Yes, and it is still needed Code Status:  full code Last date of  multidisciplinary goals of care discussion [N/a]  Labs   CBC: Recent Labs  Lab 03/20/22 2248 03/21/22 0416  WBC 9.4 8.6  NEUTROABS 5.9  --   HGB 15.5 14.8  HCT 48.5 44.6  MCV 91.7 91.0  PLT 251 970    Basic Metabolic Panel: Recent Labs  Lab 03/20/22 2248 03/21/22 0416  NA 135 133*  K 4.5 4.2  CL 92* 92*  CO2 33* 33*  GLUCOSE 179* 150*  BUN 15 14  CREATININE 0.78 0.70  CALCIUM 9.2 8.7*   GFR: Estimated Creatinine Clearance: 91.5 mL/min (by C-G formula based on SCr of 0.7 mg/dL). Recent Labs  Lab 03/20/22 2248 03/21/22 0416  WBC 9.4 8.6    Liver Function Tests: Recent Labs  Lab 03/20/22 2248  AST 36  ALT 26  ALKPHOS 68  BILITOT 0.7  PROT 7.9  ALBUMIN 4.1   No results for input(s): "LIPASE", "AMYLASE" in the last 168 hours. No results for input(s): "AMMONIA" in the last 168 hours.  ABG    Component Value Date/Time   PHART 7.41 03/21/2022 0416   PCO2ART 54 (H) 03/21/2022 0416   PO2ART 146 (H) 03/21/2022 0416   HCO3 34.2 (H) 03/21/2022 0416   O2SAT 99.8 03/21/2022 0416     Coagulation Profile: No results for input(s): "INR", "PROTIME" in the last 168 hours.  Cardiac Enzymes: No results for input(s): "CKTOTAL", "CKMB", "CKMBINDEX", "TROPONINI" in the last 168 hours.  HbA1C: No results found for: "HGBA1C"  CBG: No results for input(s): "GLUCAP" in the last 168 hours.  Review of Systems:   Unable to assess due to Intubation/sedation/critical  illness   Past Medical History:  He,  has a past medical history of COPD (chronic obstructive pulmonary disease) ([place]).   Surgical History:  No past surgical history on file.   Social History:   reports that he has been smoking cigarettes. He has been smoking an average of 1 pack per day. He has never used smokeless tobacco.   Family History:  His family history is not on file.   Allergies Allergies  Allergen Reactions   Penicillins      Home Medications  Prior to Admission medications    Medication Sig Start Date End Date Taking? Authorizing Provider  meclizine (ANTIVERT) 25 MG tablet Take 2 tablets (50 mg total) by mouth 2 (two) times daily as needed. Patient not taking: Reported on 03/20/2022 11/04/16   Gregor Hams, MD     Critical care time: 50 minutes     Darel Hong, AGACNP-BC [facility] [place] epic messenger for cross cover needs If after hours, please call E-link

## 2022-03-21 NOTE — Progress Notes (Signed)
[facility]Link Physician-Brief Progress Note Patient Name: SAADIQ POCHE DOB: 1950-10-06 MRN: [id number]   Date of Service  03/21/2022  HPI/Events of Note  Patient with acute hypoxemic respiratory failure secondary to acute exacerbation of COPD, in the context of acute RSV infection, patient is on mechanical ventilation.  eICU Interventions  New Patient Evaluation. Restraints ordered. Ventilator orders entered.        Thomasene Lot Sostenes Kauffmann 03/21/2022, 10:08 PM

## 2022-03-21 NOTE — ED Notes (Signed)
Suction stopped. OG tube at 60 cm at pt's lips

## 2022-03-21 NOTE — Progress Notes (Signed)
ABG sent to Lab. 

## 2022-03-21 NOTE — ED Notes (Signed)
RN entered room and pt sating in low 70s. MD Scotty Court and hospitalist at bedside. Per hospital Allena Katz pt is to be tubed bc he didn't tolerate the bipap. RT called at this time.

## 2022-03-21 NOTE — ED Notes (Signed)
Pt legs and arms twitching. This RN increased Propofrol per orders

## 2022-03-21 NOTE — ED Provider Notes (Signed)
.  Critical Care  Performed by: Sharman Cheek, MD Authorized by: Sharman Cheek, MD   Critical care provider statement:    Critical care time (minutes):  35   Critical care time was exclusive of:  Separately billable procedures and treating other patients   Critical care was necessary to treat or prevent imminent or life-threatening deterioration of the following conditions:  Respiratory failure   Critical care was time spent personally by me on the following activities:  Development of treatment plan with patient or surrogate, discussions with consultants, evaluation of patient's response to treatment, examination of patient, obtaining history from patient or surrogate, ordering and performing treatments and interventions, ordering and review of laboratory studies, ordering and review of radiographic studies, pulse oximetry, re-evaluation of patient's condition and review of old charts Comments:        Procedure Name: Intubation Date/Time: 03/21/2022 2:12 PM  Performed by: Sharman Cheek, MDPre-anesthesia Checklist: Patient identified, Patient being monitored, Emergency Drugs available, Timeout performed and Suction available Oxygen Delivery Method: Non-rebreather mask Preoxygenation: Pre-oxygenation with 100% oxygen Induction Type: Rapid sequence Ventilation: Mask ventilation without difficulty Laryngoscope Size: Glidescope and 3 Grade View: Grade I Tube size: 8.0 mm Number of attempts: 1 Airway Equipment and Method: Video-laryngoscopy Placement Confirmation: ETT inserted through vocal cords under direct vision, CO2 detector and Breath sounds checked- equal and bilateral Secured at: 24 cm Tube secured with: ETT holder Dental Injury: Teeth and Oropharynx as per pre-operative assessment  Comments: Upper denture removed by patient prior to intubation.      OG placement  Date/Time: 03/21/2022 2:13 PM  Performed by: Sharman Cheek, MD Authorized by: Sharman Cheek,  MD  Consent: The procedure was performed in an emergent situation. Local anesthesia used: no  Anesthesia: Local anesthesia used: no Patient tolerance: patient tolerated the procedure well with no immediate complications Comments: Placed by me with glidescope laryngoscopy, 55cm at the lips, good suction return of gastric contents        ----------------------------------------- 2:10 PM on 03/21/2022 -----------------------------------------  I was called to bedside by the hospitalist due to patient being in respiratory distress.  This patient has been admitted for COPD exacerbation in the setting of RSV infection for many hours and still boarding in the emergency department bed 2.  On arriving to the bedside, patient is tripoding, tachypneic, only able to speak 1 or 2 words at a time, in obvious respiratory distress.  Lung auscultation reveals very faint distant breath sounds consistent with severe emphysema.  Minimal air movement.  Oxygen saturation on nasal cannula was about 76%.  Patient was preoxygenated with nonrebreather, RSI with ketamine and rocuronium.  Intubated with 8-0 ET tube, maintaining good oxygen saturation throughout.  Hospitalist coordinating ICU admission.    Sharman Cheek, MD 03/21/22 1415

## 2022-03-21 NOTE — ED Notes (Signed)
[facility] arrival

## 2022-03-21 NOTE — ED Notes (Signed)
Santiago Glad ICU RN at [facility] given report. [facility] called, no time estimate of pt departure

## 2022-03-22 DIAGNOSIS — J9601 Acute respiratory failure with hypoxia: Secondary | ICD-10-CM | POA: Diagnosis not present

## 2022-03-22 LAB — GLUCOSE, CAPILLARY
Glucose-Capillary: 116 mg/dL — ABNORMAL HIGH (ref 70–99)
Glucose-Capillary: 125 mg/dL — ABNORMAL HIGH (ref 70–99)
Glucose-Capillary: 128 mg/dL — ABNORMAL HIGH (ref 70–99)
Glucose-Capillary: 133 mg/dL — ABNORMAL HIGH (ref 70–99)
Glucose-Capillary: 136 mg/dL — ABNORMAL HIGH (ref 70–99)
Glucose-Capillary: 144 mg/dL — ABNORMAL HIGH (ref 70–99)
Glucose-Capillary: 164 mg/dL — ABNORMAL HIGH (ref 70–99)

## 2022-03-22 LAB — RESPIRATORY PANEL BY PCR

## 2022-03-22 LAB — BASIC METABOLIC PANEL
Anion gap: 9 (ref 5–15)
BUN: 27 mg/dL — ABNORMAL HIGH (ref 8–23)
CO2: 28 mmol/L (ref 22–32)
Calcium: 8.7 mg/dL — ABNORMAL LOW (ref 8.9–10.3)
Chloride: 98 mmol/L (ref 98–111)
Creatinine, Ser: 1.05 mg/dL (ref 0.61–1.24)
GFR, Estimated: >60 mL/min (ref >60)
Glucose, Bld: 132 mg/dL — ABNORMAL HIGH (ref 70–99)
Potassium: 5.2 mmol/L — ABNORMAL HIGH (ref 3.5–5.1)
Sodium: 135 mmol/L (ref 135–145)

## 2022-03-22 LAB — CBC
HCT: 46.2 % (ref 39.0–52.0)
Hemoglobin: 14.5 g/dL (ref 13.0–17.0)
MCH: 30.1 pg (ref 26.0–34.0)
MCHC: 31.4 g/dL (ref 30.0–36.0)
MCV: 95.9 fL (ref 80.0–100.0)
Platelets: 259 10*3/uL (ref 150–400)
RBC: 4.82 MIL/uL (ref 4.22–5.81)
RDW: 12.6 % (ref 11.5–15.5)
WBC: 10.5 10*3/uL (ref 4.0–10.5)
nRBC: 0 % (ref 0.0–0.2)

## 2022-03-22 LAB — MAGNESIUM: Magnesium: 2.7 mg/dL — ABNORMAL HIGH (ref 1.7–2.4)

## 2022-03-22 LAB — TRIGLYCERIDES: Triglycerides: 94 mg/dL (ref <150)

## 2022-03-22 MED ORDER — CHLORHEXIDINE GLUCONATE CLOTH 2 % EX PADS
6.0000 | MEDICATED_PAD | Freq: Every day | CUTANEOUS | Status: DC
  Administered 2022-03-23 – 2022-03-26 (×4): 6 via TOPICAL

## 2022-03-22 MED ORDER — ALBUMIN HUMAN 5 % IV SOLN
25.0000 g | Freq: Once | INTRAVENOUS | Status: DC
  Filled 2022-03-22: qty 500

## 2022-03-22 MED ORDER — DEXMEDETOMIDINE HCL IN NACL 200 MCG/50ML IV SOLN
0.0000 ug/kg/h | INTRAVENOUS | Status: DC
  Administered 2022-03-22: 0.4 ug/kg/h via INTRAVENOUS
  Administered 2022-03-22: 0.5 ug/kg/h via INTRAVENOUS
  Administered 2022-03-22: 0.7 ug/kg/h via INTRAVENOUS
  Administered 2022-03-22: 0.4 ug/kg/h via INTRAVENOUS
  Administered 2022-03-23: 0.7 ug/kg/h via INTRAVENOUS
  Administered 2022-03-23: 0.6 ug/kg/h via INTRAVENOUS
  Filled 2022-03-22 (×6): qty 50

## 2022-03-22 MED ORDER — SODIUM CHLORIDE 0.9 % IV SOLN
500.0000 mg | Freq: Every day | INTRAVENOUS | Status: DC
  Administered 2022-03-22 – 2022-03-24 (×3): 500 mg via INTRAVENOUS
  Filled 2022-03-22 (×3): qty 5

## 2022-03-22 MED ORDER — SODIUM CHLORIDE 0.9 % IV SOLN: INTRAVENOUS | Status: DC

## 2022-03-22 MED ORDER — METHYLPREDNISOLONE SODIUM SUCC 40 MG IJ SOLR
40.0000 mg | Freq: Every day | INTRAMUSCULAR | Status: DC
  Administered 2022-03-22 – 2022-03-23 (×2): 40 mg via INTRAVENOUS
  Filled 2022-03-22 (×2): qty 1

## 2022-03-22 NOTE — Progress Notes (Signed)
Obtained ordered sputum (ordered 03/21/22 @ approx 2200) and sent to lab for analysis. RN aware.

## 2022-03-22 NOTE — Progress Notes (Signed)
eLink Physician-Brief Progress Note Patient Name: JAKOBI THETFORD DOB: 17-Jul-1950 MRN: [id number]   Date of Service  03/22/2022  HPI/Events of Note  Hypotension, Needs verification of OG tube and order for Foley.  eICU Interventions  Albumen 5 % 500 ml iv x 1, OG tube in good position, Foley ordered, okay to give Heparin.        Betrice Wanat U Phillip Sandler 03/22/2022, 1:22 AM

## 2022-03-22 NOTE — Progress Notes (Signed)
NAME:  Frederick Brewer, MRN:  [id number], DOB:  1950/06/23, LOS: 1 ADMISSION DATE:  03/21/2022, CONSULTATION DATE:  03/22/2022  REFERRING MD:  [facility] transfer, CHIEF COMPLAINT: Respiratory failure  History of Present Illness:  71 y.o. male admitted with Acute Hypoxic & Hypercapnic Respiratory Failure in the setting of Acute COPD Exacerbation due to RSV infection. Failed trial of BiPAP requiring intubation and mechanical ventilation. He was transferred from [facility] ED since no beds were available to admit there. He was admitted by the PCCM team there, initial VBG on BiPAP was 7.28/80 He was started on IV Solu-Medrol 60 every 12 and empiric Levaquin.  He was sedated with propofol and fentanyl.  Chest x-ray did not show any infiltrates  Pertinent  Medical History  COPD, smoker  Significant Hospital Events: Including procedures, antibiotic start and stop dates in addition to other pertinent events   12/29: Admitted by Hospitalist, requiring BiPAP. 12/30: Worsening acute respiratory distress and hypoxia (sats in 70's) despite BiPAP, requiring emergent intubation.  PCCM consulted.    Interim History / Subjective:   Critically ill intubated on mechanical life support  Objective   Blood pressure (!) 97/51, pulse (!) 54, temperature 98.1 F (36.7 C), resp. rate 15, SpO2 93 %.    Vent Mode: PRVC FiO2 (%):  [40 %-60 %] 40 % Set Rate:  [18 bmp-26 bmp] 18 bmp Vt Set:  [450 mL-600 mL] 450 mL PEEP:  [5 cmH20] 5 cmH20 Plateau Pressure:  [15 cmH20-19 cmH20] 15 cmH20   Intake/Output Summary (Last 24 hours) at 03/22/2022 0727 Last data filed at 03/22/2022 4098 Gross per 24 hour  Intake 563.67 ml  Output 200 ml  Net 363.67 ml   There were no vitals filed for this visit.  Examination: General: Elderly man, intubated on mechanical life support HENT: Endotracheal tube in place Lungs: Barrel chested, diminished breath sounds bilaterally, bilateral mechanically ventilated breath sounds Cardiovascular:  Regular rhythm S1-S2, distant heart tones Abdomen:, Nontender nondistended Extremities: No significant edema Neuro: RASS -2  Labs reviewed  Resolved Hospital Problem list     Assessment & Plan:   Acute hypoxic and hypercarbic respiratory failure, intubated on mechanical life support Acute exacerbation of COPD. RSV infection Plan: Remains on adult mechanical vent support. SAT/SBT as tolerated PAD guidelines sedation Switch from propofol to Precedex and as needed fentanyl Dropped IV Solu-Medrol to 40 mg daily Continue budesonide, Brovana, Yupelri Azithromycin x 5 days for AECOPD. Recheck labs in AM.  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  Best Practice (right click and "Reselect all SmartList Selections" daily)   Diet/type: NPO w/ meds via tube DVT prophylaxis: prophylactic heparin  GI prophylaxis: PPI Lines: N/A Foley:  Yes, and it is still needed Code Status:  full code Last date of multidisciplinary goals of care discussion [NA]  Labs   CBC: Recent Labs  Lab 03/20/22 2248 03/21/22 0416  WBC 9.4 8.6  NEUTROABS 5.9  --   HGB 15.5 14.8  HCT 48.5 44.6  MCV 91.7 91.0  PLT 251 235    Basic Metabolic Panel: Recent Labs  Lab 03/20/22 2248 03/21/22 0416  NA 135 133*  K 4.5 4.2  CL 92* 92*  CO2 33* 33*  GLUCOSE 179* 150*  BUN 15 14  CREATININE 0.78 0.70  CALCIUM 9.2 8.7*   GFR: Estimated Creatinine Clearance: 91.5 mL/min (by C-G formula based on SCr of 0.7 mg/dL). Recent Labs  Lab 03/20/22 2248 03/21/22 0416 03/21/22 1428  PROCALCITON  --   --  <  0.10  WBC 9.4 8.6  --     Liver Function Tests: Recent Labs  Lab 03/20/22 2248  AST 36  ALT 26  ALKPHOS 68  BILITOT 0.7  PROT 7.9  ALBUMIN 4.1   No results for input(s): "LIPASE", "AMYLASE" in the last 168 hours. No results for input(s): "AMMONIA" in the last 168 hours.  ABG    Component Value Date/Time   PHART 7.34 (L) 03/21/2022 2300   PCO2ART 66 (HH) 03/21/2022 2300   PO2ART 187  (H) 03/21/2022 2300   HCO3 35.1 (H) 03/21/2022 2300   O2SAT 100 03/21/2022 2300     Coagulation Profile: No results for input(s): "INR", "PROTIME" in the last 168 hours.  Cardiac Enzymes: No results for input(s): "CKTOTAL", "CKMB", "CKMBINDEX", "TROPONINI" in the last 168 hours.  HbA1C: No results found for: "HGBA1C"  CBG: Recent Labs  Lab 03/21/22 2340 03/22/22 0351  GLUCAP 136* 116*    Review of Systems:   Unable to obtain  Past Medical History:  He,  has a past medical history of COPD (chronic obstructive pulmonary disease) (HCC).   Surgical History:  No past surgical history on file.   Social History:   reports that he has been smoking cigarettes. He has been smoking an average of 1 pack per day. He has never used smokeless tobacco.   Family History:  His family history is not on file.   Allergies Allergies  Allergen Reactions   Penicillins      Home Medications  Prior to Admission medications   Medication Sig Start Date End Date Taking? Authorizing Provider  acetaminophen (TYLENOL) 325 MG tablet Take 2 tablets (650 mg total) by mouth every 6 (six) hours as needed for mild pain (or Fever >/= 101). 03/21/22   Judithe Modest, NP  budesonide (PULMICORT) 0.5 MG/2ML nebulizer solution Take 2 mLs (0.5 mg total) by nebulization 2 (two) times daily. 03/21/22   Judithe Modest, NP  chlorpheniramine-HYDROcodone (TUSSIONEX) 10-8 MG/5ML Take 5 mLs by mouth every 12 (twelve) hours as needed for cough. 03/21/22   Judithe Modest, NP  docusate (COLACE) 50 MG/5ML liquid Place 10 mLs (100 mg total) into feeding tube 2 (two) times daily. 03/21/22   Judithe Modest, NP  enoxaparin (LOVENOX) 40 MG/0.4ML injection Inject 0.4 mLs (40 mg total) into the skin daily. 03/22/22   Judithe Modest, NP  fentaNYL (SUBLIMAZE) SOLN Inject 25-100 mcg into the vein every 15 (fifteen) minutes as needed (to maintain RASS & CPOT goal.). 03/21/22   Harlon Ditty D, NP  fentaNYL 10  mcg/ml SOLN infusion Inject 25-200 mcg/hr into the vein continuous. 03/21/22   Judithe Modest, NP  guaiFENesin (MUCINEX) 600 MG 12 hr tablet Take 1 tablet (600 mg total) by mouth 2 (two) times daily. 03/21/22   Harlon Ditty D, NP  ipratropium-albuterol (DUONEB) 0.5-2.5 (3) MG/3ML SOLN Take 3 mLs by nebulization every 4 (four) hours. 03/21/22   Judithe Modest, NP  levofloxacin (LEVAQUIN) 750 MG/150ML SOLN Inject 150 mLs (750 mg total) into the vein daily. 03/22/22   Judithe Modest, NP  magnesium hydroxide (MILK OF MAGNESIA) 400 MG/5ML suspension Take 30 mLs by mouth daily as needed for mild constipation. 03/21/22   Judithe Modest, NP  methylPREDNISolone sodium succinate (SOLU-MEDROL) 125 mg/2 mL injection Inject 0.96 mLs (60 mg total) into the vein every 12 (twelve) hours. 03/21/22   Judithe Modest, NP  midazolam (VERSED) 2 MG/2ML SOLN injection Inject 1-2 mLs (1-2 mg total)  into the vein every hour as needed for sedation (to maintain RASS goal.). 03/21/22   Judithe Modest, NP  ondansetron (ZOFRAN) 4 MG tablet Take 1 tablet (4 mg total) by mouth every 6 (six) hours as needed for nausea. 03/21/22   Judithe Modest, NP  pantoprazole (PROTONIX) 40 MG injection Inject 40 mg into the vein daily. 03/22/22   Judithe Modest, NP  polyethylene glycol (MIRALAX / GLYCOLAX) 17 g packet Place 17 g into feeding tube daily. 03/22/22   Harlon Ditty D, NP  propofol (DIPRIVAN) 1000 MG/100ML EMUL injection Inject 379-6,064 mcg/min into the vein continuous. 03/21/22   Harlon Ditty D, NP  sodium chloride 0.9 % infusion Inject 100 mLs into the vein continuous. 03/21/22   Judithe Modest, NP  traZODone (DESYREL) 50 MG tablet Take 0.5 tablets (25 mg total) by mouth at bedtime as needed for sleep. 03/21/22   Harlon Ditty D, NP     This patient is critically ill with multiple organ system failure; which, requires frequent high complexity decision making, assessment, support, evaluation, and  titration of therapies. This was completed through the application of advanced monitoring technologies and extensive interpretation of multiple databases. During this encounter critical care time was devoted to patient care services described in this note for 32 minutes.  Josephine Igo, DO [facility] 03/22/2022 7:27 AM

## 2022-03-23 ENCOUNTER — Encounter (HOSPITAL_COMMUNITY): Payer: Self-pay | Admitting: Pulmonary Disease

## 2022-03-23 DIAGNOSIS — J9601 Acute respiratory failure with hypoxia: Secondary | ICD-10-CM | POA: Diagnosis not present

## 2022-03-23 LAB — BASIC METABOLIC PANEL
Anion gap: 4 — ABNORMAL LOW (ref 5–15)
BUN: 39 mg/dL — ABNORMAL HIGH (ref 8–23)
CO2: 29 mmol/L (ref 22–32)
Calcium: 8.1 mg/dL — ABNORMAL LOW (ref 8.9–10.3)
Chloride: 101 mmol/L (ref 98–111)
Creatinine, Ser: 0.84 mg/dL (ref 0.61–1.24)
GFR, Estimated: >60 mL/min (ref >60)
Glucose, Bld: 123 mg/dL — ABNORMAL HIGH (ref 70–99)
Potassium: 4.9 mmol/L (ref 3.5–5.1)
Sodium: 134 mmol/L — ABNORMAL LOW (ref 135–145)

## 2022-03-23 LAB — CBC
HCT: 36.7 % — ABNORMAL LOW (ref 39.0–52.0)
HCT: 39.9 % (ref 39.0–52.0)
HCT: 40.9 % (ref 39.0–52.0)
Hemoglobin: 11.8 g/dL — ABNORMAL LOW (ref 13.0–17.0)
Hemoglobin: 12.4 g/dL — ABNORMAL LOW (ref 13.0–17.0)
Hemoglobin: 12.9 g/dL — ABNORMAL LOW (ref 13.0–17.0)
MCH: 30.4 pg (ref 26.0–34.0)
MCH: 29.5 pg (ref 26.0–34.0)
MCH: 30.2 pg (ref 26.0–34.0)
MCHC: 32.2 g/dL (ref 30.0–36.0)
MCHC: 31.1 g/dL (ref 30.0–36.0)
MCHC: 31.5 g/dL (ref 30.0–36.0)
MCV: 94.6 fL (ref 80.0–100.0)
MCV: 95 fL (ref 80.0–100.0)
MCV: 95.8 fL (ref 80.0–100.0)
Platelets: 239 10*3/uL (ref 150–400)
Platelets: 255 10*3/uL (ref 150–400)
Platelets: 268 10*3/uL (ref 150–400)
RBC: 3.88 MIL/uL — ABNORMAL LOW (ref 4.22–5.81)
RBC: 4.2 MIL/uL — ABNORMAL LOW (ref 4.22–5.81)
RBC: 4.27 MIL/uL (ref 4.22–5.81)
RDW: 12.7 % (ref 11.5–15.5)
RDW: 12.7 % (ref 11.5–15.5)
RDW: 12.7 % (ref 11.5–15.5)
WBC: 9.8 10*3/uL (ref 4.0–10.5)
WBC: 11.2 10*3/uL — ABNORMAL HIGH (ref 4.0–10.5)
WBC: 13.1 10*3/uL — ABNORMAL HIGH (ref 4.0–10.5)
nRBC: 0 % (ref 0.0–0.2)
nRBC: 0 % (ref 0.0–0.2)
nRBC: 0 % (ref 0.0–0.2)

## 2022-03-23 LAB — GLUCOSE, CAPILLARY
Glucose-Capillary: 104 mg/dL — ABNORMAL HIGH (ref 70–99)
Glucose-Capillary: 106 mg/dL — ABNORMAL HIGH (ref 70–99)
Glucose-Capillary: 119 mg/dL — ABNORMAL HIGH (ref 70–99)
Glucose-Capillary: 70 mg/dL (ref 70–99)
Glucose-Capillary: 81 mg/dL (ref 70–99)
Glucose-Capillary: 93 mg/dL (ref 70–99)

## 2022-03-23 MED ORDER — ORAL CARE MOUTH RINSE: 15.0000 mL | OROMUCOSAL | Status: DC | PRN

## 2022-03-23 MED ORDER — PNEUMOCOCCAL 20-VAL CONJ VACC 0.5 ML IM SUSY
0.5000 mL | PREFILLED_SYRINGE | INTRAMUSCULAR | Status: DC
  Filled 2022-03-23: qty 0.5

## 2022-03-23 MED ORDER — INFLUENZA VAC A&B SA ADJ QUAD 0.5 ML IM PRSY
0.5000 mL | PREFILLED_SYRINGE | INTRAMUSCULAR | Status: DC
  Filled 2022-03-23: qty 0.5

## 2022-03-23 MED ORDER — LACTATED RINGERS IV BOLUS
500.0000 mL | Freq: Once | INTRAVENOUS | Status: AC
  Administered 2022-03-23: 500 mL via INTRAVENOUS

## 2022-03-23 NOTE — Procedures (Signed)
Extubation Procedure Note  Patient Details:   Name: MALLORY ENRIQUES DOB: [birth year] MRN: [id number]   Airway Documentation:    Vent end date: 03/23/22 Vent end time: 0905   Evaluation  O2 sats: stable throughout Complications: No apparent complications Patient did tolerate procedure well. Bilateral Breath Sounds: Diminished  Pt placed on 6l nasal cannula.  Tamera Reason 03/23/2022, 9:09 AM

## 2022-03-23 NOTE — Progress Notes (Signed)
eLink Physician-Brief Progress Note Patient Name: Frederick Brewer DOB: 09-May-1950 MRN: [id number]   Date of Service  03/23/2022  HPI/Events of Note  Mild hypotension. ON 35% fio2.   eICU Interventions  LR bolus 500 cc x 1     Intervention Category Major Interventions: Hypotension - evaluation and management  [name] [name] 03/23/2022, 2:12 AM

## 2022-03-23 NOTE — Progress Notes (Signed)
NAME:  Frederick Brewer, MRN:  [id number], DOB:  04-10-1950, LOS: 2 ADMISSION DATE:  03/21/2022, CONSULTATION DATE:  03/23/2022  REFERRING MD:  [facility] transfer, CHIEF COMPLAINT: Respiratory failure  History of Present Illness:  72 y.o. male admitted with Acute Hypoxic & Hypercapnic Respiratory Failure in the setting of Acute COPD Exacerbation due to RSV infection. Failed trial of BiPAP requiring intubation and mechanical ventilation. He was transferred from [facility] ED since no beds were available to admit there. He was admitted by the PCCM team there, initial VBG on BiPAP was 7.28/80 He was started on IV Solu-Medrol 60 every 12 and empiric Levaquin.  He was sedated with propofol and fentanyl.  Chest x-ray did not show any infiltrates  Pertinent  Medical History  COPD, smoker  Significant Hospital Events: Including procedures, antibiotic start and stop dates in addition to other pertinent events   12/29: Admitted by Hospitalist, requiring BiPAP. 12/30: Worsening acute respiratory distress and hypoxia (sats in 70's) despite BiPAP, requiring emergent intubation.  PCCM consulted.    Interim History / Subjective:   Patient remains critically ill intubated on life support, with sedation held able to tolerate SAT SBT.  Objective   Blood pressure (!) 96/50, pulse (!) 43, temperature 98.6 F (37 C), resp. rate 14, SpO2 94 %.    Vent Mode: PSV FiO2 (%):  [35 %-40 %] 35 % Set Rate:  [15 bmp] 15 bmp Vt Set:  [450 mL] 450 mL PEEP:  [5 cmH20-511 cmH20] 5 cmH20 Pressure Support:  [5 cmH20-8 cmH20] 5 cmH20 Plateau Pressure:  [14 cmH20-20 cmH20] 14 cmH20   Intake/Output Summary (Last 24 hours) at 03/23/2022 0902 Last data filed at 03/23/2022 0725 Gross per 24 hour  Intake 2430.61 ml  Output 600 ml  Net 1830.61 ml   There were no vitals filed for this visit.  Examination: General: Elderly gentleman, intubated on mechanical life support HENT: Endotracheal tube in place Lungs: Barrel chested, diminished  breath sounds bilaterally, bilateral ventilated breath sounds Cardiovascular: Regular rate rhythm, S1-S2 Abdomen:, Nontender nondistended Extremities: No edema Neuro: RASS 0  Labs reviewed  Resolved Hospital Problem list     Assessment & Plan:   Acute hypoxic and hypercarbic respiratory failure, intubated on mechanical life support Acute exacerbation of COPD. 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, acute illness Continue to observe  Anemia Acute hemoglobin drop No signs of bleeding however P: Recheck CBC this evening  Observe for bleeding    Best Practice (right click and "Reselect all SmartList Selections" daily)   Diet/type: NPO w/ meds via tube DVT prophylaxis: prophylactic heparin  GI prophylaxis: PPI Lines: N/A Foley:  Yes, and it is still needed Code Status:  full code Last date of multidisciplinary goals of care discussion [NA]  Labs   CBC: Recent Labs  Lab 03/20/22 2248 03/21/22 0416 03/22/22 0902 03/23/22 0322  WBC 9.4 8.6 10.5 9.8  NEUTROABS 5.9  --   --   --   HGB 15.5 14.8 14.5 11.8*  HCT 48.5 44.6 46.2 36.7*  MCV 91.7 91.0 95.9 94.6  PLT 251 235 259 239    Basic Metabolic Panel: Recent Labs  Lab 03/20/22 2248 03/21/22 0416 03/22/22 0902 03/23/22 0322  NA 135 133* 135 134*  K 4.5 4.2 5.2* 4.9  CL 92* 92* 98 101  CO2 33*  33* 28 29  GLUCOSE 179* 150* 132* 123*  BUN 15 14 27* 39*  CREATININE 0.78 0.70 1.05 0.84  CALCIUM 9.2 8.7* 8.7* 8.1*  MG  --   --  2.7*  --    GFR: Estimated Creatinine Clearance: 87.2 mL/min (by C-G formula based on SCr of 0.84 mg/dL). Recent Labs  Lab 03/20/22 2248 03/21/22 0416 03/21/22 1428 03/22/22 0902 03/23/22 0322  PROCALCITON  --   --  <0.10  --   --   WBC 9.4 8.6  --  10.5 9.8    Liver Function Tests: Recent  Labs  Lab 03/20/22 2248  AST 36  ALT 26  ALKPHOS 68  BILITOT 0.7  PROT 7.9  ALBUMIN 4.1   No results for input(s): "LIPASE", "AMYLASE" in the last 168 hours. No results for input(s): "AMMONIA" in the last 168 hours.  ABG    Component Value Date/Time   PHART 7.34 (L) 03/21/2022 2300   PCO2ART 66 (HH) 03/21/2022 2300   PO2ART 187 (H) 03/21/2022 2300   HCO3 35.1 (H) 03/21/2022 2300   O2SAT 100 03/21/2022 2300     Coagulation Profile: No results for input(s): "INR", "PROTIME" in the last 168 hours.  Cardiac Enzymes: No results for input(s): "CKTOTAL", "CKMB", "CKMBINDEX", "TROPONINI" in the last 168 hours.  HbA1C: No results found for: "HGBA1C"  CBG: Recent Labs  Lab 03/22/22 1940 03/22/22 2319 03/22/22 2346 03/23/22 0440 03/23/22 0737  GLUCAP 136* 133* 128* 119* 106*    Review of Systems:   Unable to obtain  Past Medical History:  He,  has a past medical history of COPD (chronic obstructive pulmonary disease) ([place]).   Surgical History:  No past surgical history on file.   Social History:   reports that he has been smoking cigarettes. He has been smoking an average of 1 pack per day. He has never used smokeless tobacco.   Family History:  His family history is not on file.   Allergies Allergies  Allergen Reactions   Penicillins      Home Medications  Prior to Admission medications   Medication Sig Start Date End Date Taking? Authorizing Provider  acetaminophen (TYLENOL) 325 MG tablet Take 2 tablets (650 mg total) by mouth every 6 (six) hours as needed for mild pain (or Fever >/= 101). 03/21/22   Bradly Bienenstock, NP  budesonide (PULMICORT) 0.5 MG/2ML nebulizer solution Take 2 mLs (0.5 mg total) by nebulization 2 (two) times daily. 03/21/22   Bradly Bienenstock, NP  chlorpheniramine-HYDROcodone (TUSSIONEX) 10-8 MG/5ML Take 5 mLs by mouth every 12 (twelve) hours as needed for cough. 03/21/22   Bradly Bienenstock, NP  docusate (COLACE) 50 MG/5ML liquid  Place 10 mLs (100 mg total) into feeding tube 2 (two) times daily. 03/21/22   Bradly Bienenstock, NP  enoxaparin (LOVENOX) 40 MG/0.4ML injection Inject 0.4 mLs (40 mg total) into the skin daily. 03/22/22   Bradly Bienenstock, NP  fentaNYL (SUBLIMAZE) SOLN Inject 25-100 mcg into the vein every 15 (fifteen) minutes as needed (to maintain RASS & CPOT goal.). 03/21/22   Darel Hong D, NP  fentaNYL 10 mcg/ml SOLN infusion Inject 25-200 mcg/hr into the vein continuous. 03/21/22   Bradly Bienenstock, NP  guaiFENesin (MUCINEX) 600 MG 12 hr tablet Take 1 tablet (600 mg total) by mouth 2 (two) times daily. 03/21/22   Darel Hong D, NP  ipratropium-albuterol (DUONEB) 0.5-2.5 (3) MG/3ML SOLN Take 3 mLs by nebulization every 4 (four) hours. 03/21/22   Dewaine Conger,  Denyce Robert, NP  levofloxacin (LEVAQUIN) 750 MG/150ML SOLN Inject 150 mLs (750 mg total) into the vein daily. 03/22/22   Bradly Bienenstock, NP  magnesium hydroxide (MILK OF MAGNESIA) 400 MG/5ML suspension Take 30 mLs by mouth daily as needed for mild constipation. 03/21/22   Bradly Bienenstock, NP  methylPREDNISolone sodium succinate (SOLU-MEDROL) 125 mg/2 mL injection Inject 0.96 mLs (60 mg total) into the vein every 12 (twelve) hours. 03/21/22   Bradly Bienenstock, NP  midazolam (VERSED) 2 MG/2ML SOLN injection Inject 1-2 mLs (1-2 mg total) into the vein every hour as needed for sedation (to maintain RASS goal.). 03/21/22   Bradly Bienenstock, NP  ondansetron (ZOFRAN) 4 MG tablet Take 1 tablet (4 mg total) by mouth every 6 (six) hours as needed for nausea. 03/21/22   Bradly Bienenstock, NP  pantoprazole (PROTONIX) 40 MG injection Inject 40 mg into the vein daily. 03/22/22   Bradly Bienenstock, NP  polyethylene glycol (MIRALAX / GLYCOLAX) 17 g packet Place 17 g into feeding tube daily. 03/22/22   Darel Hong D, NP  propofol (DIPRIVAN) 1000 MG/100ML EMUL injection Inject 379-6,064 mcg/min into the vein continuous. 03/21/22   Darel Hong D, NP  sodium  chloride 0.9 % infusion Inject 100 mLs into the vein continuous. 03/21/22   Bradly Bienenstock, NP  traZODone (DESYREL) 50 MG tablet Take 0.5 tablets (25 mg total) by mouth at bedtime as needed for sleep. 03/21/22   Darel Hong D, NP    This patient is critically ill with multiple organ system failure; which, requires frequent high complexity decision making, assessment, support, evaluation, and titration of therapies. This was completed through the application of advanced monitoring technologies and extensive interpretation of multiple databases. During this encounter critical care time was devoted to patient care services described in this note for 32 minutes.  Garner Nash, DO [facility] 03/23/2022 9:03 AM

## 2022-03-24 DIAGNOSIS — J9601 Acute respiratory failure with hypoxia: Secondary | ICD-10-CM | POA: Diagnosis not present

## 2022-03-24 LAB — GLUCOSE, CAPILLARY
Glucose-Capillary: 100 mg/dL — ABNORMAL HIGH (ref 70–99)
Glucose-Capillary: 154 mg/dL — ABNORMAL HIGH (ref 70–99)
Glucose-Capillary: 166 mg/dL — ABNORMAL HIGH (ref 70–99)
Glucose-Capillary: 69 mg/dL — ABNORMAL LOW (ref 70–99)
Glucose-Capillary: 77 mg/dL (ref 70–99)
Glucose-Capillary: 85 mg/dL (ref 70–99)
Glucose-Capillary: 86 mg/dL (ref 70–99)
Glucose-Capillary: 86 mg/dL (ref 70–99)

## 2022-03-24 LAB — LEGIONELLA PNEUMOPHILA SEROGP 1 UR AG: L. pneumophila Serogp 1 Ur Ag: NEGATIVE

## 2022-03-24 LAB — CULTURE, RESPIRATORY W GRAM STAIN: Culture: NORMAL

## 2022-03-24 MED ORDER — AZITHROMYCIN 250 MG PO TABS
500.0000 mg | ORAL_TABLET | Freq: Once | ORAL | Status: AC
  Administered 2022-03-25: 500 mg via ORAL
  Filled 2022-03-24: qty 2

## 2022-03-24 MED ORDER — ALPRAZOLAM 0.5 MG PO TABS
0.5000 mg | ORAL_TABLET | Freq: Two times a day (BID) | ORAL | Status: DC | PRN
  Administered 2022-03-24 – 2022-03-27 (×5): 0.5 mg via ORAL
  Filled 2022-03-24 (×5): qty 1

## 2022-03-24 MED ORDER — MELATONIN 5 MG PO TABS
5.0000 mg | ORAL_TABLET | Freq: Every day | ORAL | Status: DC
  Administered 2022-03-24 – 2022-03-26 (×3): 5 mg via ORAL
  Filled 2022-03-24 (×3): qty 1

## 2022-03-24 MED ORDER — PREDNISONE 20 MG PO TABS
40.0000 mg | ORAL_TABLET | Freq: Every day | ORAL | Status: DC
  Administered 2022-03-24 – 2022-03-27 (×4): 40 mg via ORAL
  Filled 2022-03-24 (×4): qty 2

## 2022-03-24 MED ORDER — ALPRAZOLAM 0.5 MG PO TABS
0.5000 mg | ORAL_TABLET | Freq: Three times a day (TID) | ORAL | Status: DC | PRN
  Administered 2022-03-24: 0.5 mg via ORAL
  Filled 2022-03-24: qty 1

## 2022-03-24 MED ORDER — ONDANSETRON HCL 4 MG/2ML IJ SOLN
INTRAMUSCULAR | Status: AC
  Administered 2022-03-24: 4 mg via INTRAVENOUS
  Filled 2022-03-24: qty 2

## 2022-03-24 MED ORDER — ONDANSETRON HCL 4 MG/2ML IJ SOLN: 4.0000 mg | Freq: Four times a day (QID) | INTRAMUSCULAR | Status: DC | PRN

## 2022-03-24 NOTE — Evaluation (Signed)
Occupational Therapy Evaluation Patient Details Name: ABDULAI Brewer MRN: [id number] DOB: 1950/09/27 Today's Date: 03/24/2022   History of Present Illness Patient is a 72 year old male who presented to the ER on 12/29 with wheezing, cough, sputum, and worsening dyspnea. Patient was noted to have significant respiratory distress requiring intubation and mechanical ventilation transitioned to [facility] SDU. Patient was extubated on 1/1. Patient also noted to have acute hypoxic and hypercarbic respiratory failure, acute exacerbation of COPD,and  RSV. Frederick Brewer   Clinical Impression   Patient is a 72 year old male who was admitted for above. 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  Patient was +2 for transfers with RW for safety with unsteadiness noted in BLE. Patient was TD for LB dressing/bathing tasks at this time. Patient would need increased caregiver support to transition home at time of d/c as recommended below. Patient was noted to have decreased functional activity tolerance, decreased endurance, decreased standing balance, decreased safety awareness, and decreased knowledge of AD/AE impacting participation in ADLs. Patient would continue to benefit from skilled OT services at this time while admitted and after d/c to address noted deficits in order to improve overall safety and independence in ADLs.        Recommendations for follow up therapy are one component of a multi-disciplinary discharge planning process, led by the attending physician.  Recommendations may be updated based on patient status, additional functional criteria and insurance authorization.   Follow Up Recommendations  Home health OT     Assistance Recommended at Discharge Frequent or constant Supervision/Assistance  Patient can return home with the following A lot of help with bathing/dressing/bathroom;A lot of help with walking and/or transfers;Direct supervision/assist for  financial management;Help with stairs or ramp for entrance;Assist for transportation;Direct supervision/assist for medications management;Assistance with cooking/housework    Functional Status Assessment  Patient has had a recent decline in their functional status and demonstrates the ability to make significant improvements in function in a reasonable and predictable amount of time.  Equipment Recommendations  Other (comment) (TBD)    Recommendations for Other Services       Precautions / Restrictions Precautions Precautions: Fall Precaution Comments: monitor O2 Restrictions Weight Bearing Restrictions: No      Mobility Bed Mobility Overal bed mobility: Needs Assistance Bed Mobility: Supine to Sit     Supine to sit: Mod assist, +2 for safety/equipment           Balance Overall balance assessment: Mild deficits observed, not formally tested         ADL either performed or assessed with clinical judgement   ADL Overall ADL's : Needs assistance/impaired Eating/Feeding: Set up;Sitting   Grooming: Set up;Sitting   Upper Body Bathing: Minimal assistance;Sitting   Lower Body Bathing: Total assistance;Bed level   Upper Body Dressing : Minimal assistance;Sitting   Lower Body Dressing: Bed level;Total assistance Lower Body Dressing Details (indicate cue type and reason): unable to don socks on this date. TD to don at bed level. Toilet Transfer: +2 for safety/equipment;+2 for physical assistance;Minimal assistance;Rolling walker (2 wheels) Toilet Transfer Details (indicate cue type and reason): to transfer to 3 in1  commode and then to recliner with +2 needed for safety with lines. Toileting- Clothing Manipulation and Hygiene: Total assistance;Sit to/from stand       Functional mobility during ADLs: Minimal assistance;+2 for physical assistance;+2 for safety/equipment       Vision Patient Visual Report: No change from baseline  Pertinent  Vitals/Pain Pain Assessment Pain Assessment: Faces Faces Pain Scale: Hurts a little bit Pain Location: IV sites on LUE Pain Descriptors / Indicators: Discomfort Pain Intervention(s): Monitored during session (nurse made aware)     Hand Dominance  (ambidexterious)   Extremity/Trunk Assessment Upper Extremity Assessment Upper Extremity Assessment: Generalized weakness   Lower Extremity Assessment Lower Extremity Assessment: Generalized weakness   Cervical / Trunk Assessment Cervical / Trunk Assessment: Kyphotic;Other exceptions Cervical / Trunk Exceptions: tripod props when sitting   Communication Communication Communication: No difficulties   Cognition Arousal/Alertness: Awake/alert Behavior During Therapy: WFL for tasks assessed/performed Overall Cognitive Status: Within Functional Limits for tasks assessed           General Comments  sits with tripod propping            Home Living Family/patient expects to be discharged to:: Private residence Living Arrangements: Alone;Non-relatives/Friends     Home Access: Stairs to enter Technical brewer of Steps: 2-3 steps   Home Layout: One level     Bathroom Shower/Tub: Tub/shower unit        Prior Functioning/Environment Prior Level of Function : Independent/Modified Independent              OT Problem List: Impaired balance (sitting and/or standing);Decreased coordination;Decreased cognition;Decreased knowledge of use of DME or AE;Decreased safety awareness;Decreased knowledge of precautions      OT Treatment/Interventions: Self-care/ADL training;Therapeutic exercise;DME and/or AE instruction;Patient/family education;Therapeutic activities;Balance training    OT Goals(Current goals can be found in the care plan section) Acute Rehab OT Goals Patient Stated Goal: to get back home ASAP OT Goal Formulation: With patient Time For Goal Achievement: 04/07/22 Potential to Achieve Goals: Fair  OT  Frequency: Min 2X/week    Co-evaluation PT/OT/SLP Co-Evaluation/Treatment: Yes Reason for Co-Treatment: For patient/therapist safety;To address functional/ADL transfers PT goals addressed during session: Mobility/safety with mobility OT goals addressed during session: ADL's and self-care      AM-PAC OT "6 Clicks" Daily Activity     Outcome Measure Help from another person eating meals?: A Little Help from another person taking care of personal grooming?: A Little Help from another person toileting, which includes using toliet, bedpan, or urinal?: A Lot Help from another person bathing (including washing, rinsing, drying)?: A Lot Help from another person to put on and taking off regular upper body clothing?: A Lot Help from another person to put on and taking off regular lower body clothing?: A Lot 6 Click Score: 14   End of Session Equipment Utilized During Treatment: Gait belt;Rolling walker (2 wheels);Oxygen Nurse Communication: Mobility status  Activity Tolerance: Patient limited by fatigue Patient left: in chair;with call bell/phone within reach;with chair alarm set  OT Visit Diagnosis: Unsteadiness on feet (R26.81);Other abnormalities of gait and mobility (R26.89);Muscle weakness (generalized) (M62.81)                Time: 5093-2671 OT Time Calculation (min): 27 min Charges:  OT General Charges $OT Visit: 1 Visit OT Evaluation $OT Eval Moderate Complexity: 1 Mod  Frederick Brewer OTR/L, MS Acute Rehabilitation Department Office# [phone]   Willa Rough 03/24/2022, 3:57 PM

## 2022-03-24 NOTE — Progress Notes (Signed)
   NAME:  Frederick Brewer, MRN:  [id number], DOB:  1951/02/25, LOS: 3 ADMISSION DATE:  03/21/2022, CONSULTATION DATE:  03/24/2022  REFERRING MD:  [facility] transfer, CHIEF COMPLAINT: Respiratory failure  History of Present Illness:  72 y.o. male admitted with Acute Hypoxic & Hypercapnic Respiratory Failure in the setting of Acute COPD Exacerbation due to RSV infection. Failed trial of BiPAP requiring intubation and mechanical ventilation. He was transferred from [facility] ED since no beds were available to admit there. He was admitted by the PCCM team there, initial VBG on BiPAP was 7.28/80 He was started on IV Solu-Medrol 60 every 12 and empiric Levaquin.  He was sedated with propofol and fentanyl.  Chest x-ray did not show any infiltrates  Pertinent  Medical History  COPD, smoker  Significant Hospital Events: Including procedures, antibiotic start and stop dates in addition to other pertinent events   12/29: Admitted by Hospitalist, requiring BiPAP. 12/30: Worsening acute respiratory distress and hypoxia (sats in 70's) despite BiPAP, requiring emergent intubation.  PCCM consulted.   1/1 Extubated  Interim History / Subjective:  Extubated yesterday.  Asking for sleep aid as has not slept in days. On 10L O2, sats 100%. Have not yet attempted to wean.  Objective   Blood pressure (!) 144/53, pulse 74, temperature 97.8 F (36.6 C), temperature source Axillary, resp. rate (!) 24, height 5\' 11"  (1.803 m), weight 48.2 kg, SpO2 97 %.    Vent Mode: PSV FiO2 (%):  [35 %] 35 % PEEP:  [5 cmH20] 5 cmH20 Pressure Support:  [5 cmH20] 5 cmH20   Intake/Output Summary (Last 24 hours) at 03/24/2022 0729 Last data filed at 03/24/2022 0412 Gross per 24 hour  Intake 1835.88 ml  Output 1000 ml  Net 835.88 ml    Filed Weights   03/23/22 1817 03/24/22 0500  Weight: 71.2 kg 48.2 kg    Examination:  General: Adult male, resting in bed, in NAD. Neuro: A&O x 3, no deficits. HEENT: [place]/AT. Sclerae anicteric.  EOMI. Cardiovascular: RRR, no M/R/G.  Lungs: Respirations even and unlabored.  CTA bilaterally, No W/R/R. Abdomen: BS x 4, soft, NT/ND.  Musculoskeletal: No gross deformities, no edema.  Skin: Intact, warm, no rashes.   Assessment & Plan:   Acute hypoxic and hypercarbic respiratory failure requiring intubation - s/p extubation 1/1. Acute exacerbation of COPD. RSV infection. Tobacco dependence.  - Continue supplemental O2 as needed to maintain SpO2 > 90%. - Once O2 requirements improve some, he will need ambulatory desaturation study to assess for home O2 needs. - PT/OT eval. - Bronchial hygiene. - Transition Solumedrol to Prednisone and start taper to off. - Continue BD's. - Complete Azithromycin x 5 days. - Tobacco cessation counseling.  Hyponatremia - stable, ? SIADH. - Supportive care.  Stable to transfer out of ICU. Will ask TRH to resume care in AM 1/3 with PCCM off. Please call back if we can be of further assistance.  Best Practice (right click and "Reselect all SmartList Selections" daily)   Diet/type: Regular consistency (see orders)  DVT prophylaxis: prophylactic heparin  GI prophylaxis: PPI Lines: N/A Foley:  Yes, and it is still needed Code Status:  full code Last date of multidisciplinary goals of care discussion [NA]  CC time: N/A.   Montey Hora, [facility] For pager details, please see AMION or use Epic chat  After 1900, please call [facility] for cross coverage needs 03/24/2022, 8:12 AM

## 2022-03-24 NOTE — Progress Notes (Signed)
Pt seen and given scheduled nebulizer treatment which he tolerated well.  HR82, RR22, spo2 95% on 4L hfnc.  Pt is awake, alert, watching tv, no increased wob / respiratory distress noted.  Bipap not indicated at this time.

## 2022-03-24 NOTE — Evaluation (Signed)
Physical Therapy Evaluation Patient Details Name: ROMMIE DUNN MRN: [id number] DOB: Jul 22, 1950 Today's Date: 03/24/2022  History of Present Illness  Patient is a 72 year old male who presented to the ER on 12/29 with wheezing, cough, sputum, and worsening dyspnea. Patient was noted to have significant respiratory distress requiring intubation and mechanical ventilation transitioned to [facility] SDU. Patient was extubated on 1/1. Patient also noted to have acute hypoxic and hypercarbic respiratory failure, acute exacerbation of COPD,and  RSV. [id number]  Clinical Impression  Pt admitted with above diagnosis.  Pt currently with functional limitations due to the deficits listed below (see PT Problem List). Pt will benefit from skilled PT to increase their independence and safety with mobility to allow discharge to the venue listed below.     The patient is resting in bed, SPO2 955 on 8 L HFNC. Patient moves slowly with frequent rest breaks. Patient  stood and stepped to [facility] then to recliner. SPO2 remained >90%. Patient  with noted SOB with activity.  Patient should  progress to Dc home with support from friends.     Recommendations for follow up therapy are one component of a multi-disciplinary discharge planning process, led by the attending physician.  Recommendations may be updated based on patient status, additional functional criteria and insurance authorization.  Follow Up Recommendations Home health PT      Assistance Recommended at Discharge Frequent or constant Supervision/Assistance  Patient can return home with the following  A lot of help with walking and/or transfers;A little help with bathing/dressing/bathroom;Assistance with cooking/housework;Assist for transportation;Help with stairs or ramp for entrance    Equipment Recommendations  (TBD)  Recommendations for Other Services       Functional Status Assessment Patient has had a recent decline in their functional status and  demonstrates the ability to make significant improvements in function in a reasonable and predictable amount of time.     Precautions / Restrictions Precautions Precautions: Fall Precaution Comments: monitor O2 Restrictions Weight Bearing Restrictions: No      Mobility  Bed Mobility Overal bed mobility: Needs Assistance       Supine to sit: Mod assist, +2 for safety/equipment          Transfers Overall transfer level: Needs assistance Equipment used: Rolling walker (2 wheels) Transfers: Sit to/from Stand, Bed to chair/wheelchair/BSC Sit to Stand: Mod assist, +2 safety/equipment   Step pivot transfers: +2 safety/equipment       General transfer comment: assist to rise from bed and BSC, k nees stay slightly flexed, shuffle steps to [facility] then to recliner, trunk flexed    Ambulation/Gait                  Stairs            Wheelchair Mobility    Modified Rankin (Stroke Patients Only)       Balance Overall balance assessment: Mild deficits observed, not formally tested                                           Pertinent Vitals/Pain Pain Assessment Faces Pain Scale: Hurts a little bit Pain Location: IV sites on LUE Pain Descriptors / Indicators: Discomfort Pain Intervention(s): Monitored during session    Home Living Family/patient expects to be discharged to:: Private residence Living Arrangements: Alone;Non-relatives/Friends     Home Access: Stairs to enter   Technical brewer  of Steps: 2-3 steps   Home Layout: One level        Prior Function Prior Level of Function : Independent/Modified Independent                     Hand Dominance   Dominant Hand:  (ambidexterious)    Extremity/Trunk Assessment   Upper Extremity Assessment Upper Extremity Assessment: Generalized weakness    Lower Extremity Assessment Lower Extremity Assessment: Generalized weakness    Cervical / Trunk  Assessment Cervical / Trunk Assessment: Kyphotic;Other exceptions Cervical / Trunk Exceptions: tripod props when sitting  Communication   Communication: No difficulties  Cognition Arousal/Alertness: Awake/alert Behavior During Therapy: WFL for tasks assessed/performed Overall Cognitive Status: Within Functional Limits for tasks assessed                                          General Comments General comments (skin integrity, edema, etc.): sits with tripod propping    Exercises     Assessment/Plan    PT Assessment Patient needs continued PT services  PT Problem List Decreased strength;Decreased knowledge of precautions;Decreased mobility;Cardiopulmonary status limiting activity;Decreased activity tolerance       PT Treatment Interventions DME instruction;Functional mobility training;Therapeutic activities;Gait training;Patient/family education;Therapeutic exercise    PT Goals (Current goals can be found in the Care Plan section)  Acute Rehab PT Goals Patient Stated Goal: go home, finish my building PT Goal Formulation: With patient Time For Goal Achievement: 04/07/22 Potential to Achieve Goals: Good    Frequency Min 3X/week     Co-evaluation PT/OT/SLP Co-Evaluation/Treatment: Yes Reason for Co-Treatment: For patient/therapist safety;To address functional/ADL transfers PT goals addressed during session: Mobility/safety with mobility OT goals addressed during session: ADL's and self-care       AM-PAC PT "6 Clicks" Mobility  Outcome Measure Help needed turning from your back to your side while in a flat bed without using bedrails?: A Little Help needed moving from lying on your back to sitting on the side of a flat bed without using bedrails?: A Little Help needed moving to and from a bed to a chair (including a wheelchair)?: A Lot Help needed standing up from a chair using your arms (e.g., wheelchair or bedside chair)?: A Lot Help needed to walk in  hospital room?: Total Help needed climbing 3-5 steps with a railing? : Total 6 Click Score: 12    End of Session Equipment Utilized During Treatment: Gait belt Activity Tolerance: Patient limited by fatigue;Treatment limited secondary to medical complications (Comment) Patient left: in chair;with call bell/phone within reach;with chair alarm set Nurse Communication: Mobility status PT Visit Diagnosis: Muscle weakness (generalized) (M62.81);Difficulty in walking, not elsewhere classified (R26.2)    Time: 4034-7425 PT Time Calculation (min) (ACUTE ONLY): 23 min   Charges:   PT Evaluation $PT Eval Low Complexity: 1 Low          [place] Office [phone] Weekend [phone]   Claretha Cooper 03/24/2022, 3:55 PM

## 2022-03-24 NOTE — Progress Notes (Signed)
Walked into pt room. Pt trying to stand. IV out. Pt able to answer orientation question but forgetful. Pt placed back to bed with walker with the help of tech. Placed gauze where IV came out. Pr stable at this time

## 2022-03-25 DIAGNOSIS — J9601 Acute respiratory failure with hypoxia: Secondary | ICD-10-CM

## 2022-03-25 LAB — CBC WITH DIFFERENTIAL/PLATELET
Abs Immature Granulocytes: 0.05 10*3/uL (ref 0.00–0.07)
Basophils Absolute: 0 10*3/uL (ref 0.0–0.1)
Basophils Relative: 0 %
Eosinophils Absolute: 0.1 10*3/uL (ref 0.0–0.5)
Eosinophils Relative: 1 %
HCT: 40.8 % (ref 39.0–52.0)
Hemoglobin: 12.6 g/dL — ABNORMAL LOW (ref 13.0–17.0)
Immature Granulocytes: 1 %
Lymphocytes Relative: 25 %
Lymphs Abs: 2.4 10*3/uL (ref 0.7–4.0)
MCH: 29.5 pg (ref 26.0–34.0)
MCHC: 30.9 g/dL (ref 30.0–36.0)
MCV: 95.6 fL (ref 80.0–100.0)
Monocytes Absolute: 0.8 10*3/uL (ref 0.1–1.0)
Monocytes Relative: 8 %
Neutro Abs: 6.4 10*3/uL (ref 1.7–7.7)
Neutrophils Relative %: 65 %
Platelets: 273 10*3/uL (ref 150–400)
RBC: 4.27 MIL/uL (ref 4.22–5.81)
RDW: 12.8 % (ref 11.5–15.5)
WBC: 9.7 10*3/uL (ref 4.0–10.5)
nRBC: 0 % (ref 0.0–0.2)

## 2022-03-25 LAB — BASIC METABOLIC PANEL
Anion gap: 8 (ref 5–15)
BUN: 13 mg/dL (ref 8–23)
CO2: 33 mmol/L — ABNORMAL HIGH (ref 22–32)
Calcium: 8.4 mg/dL — ABNORMAL LOW (ref 8.9–10.3)
Chloride: 96 mmol/L — ABNORMAL LOW (ref 98–111)
Creatinine, Ser: 0.63 mg/dL (ref 0.61–1.24)
GFR, Estimated: >60 mL/min (ref >60)
Glucose, Bld: 98 mg/dL (ref 70–99)
Potassium: 3.9 mmol/L (ref 3.5–5.1)
Sodium: 137 mmol/L (ref 135–145)

## 2022-03-25 LAB — TRIGLYCERIDES: Triglycerides: 57 mg/dL

## 2022-03-25 LAB — GLUCOSE, CAPILLARY
Glucose-Capillary: 83 mg/dL (ref 70–99)
Glucose-Capillary: 105 mg/dL — ABNORMAL HIGH (ref 70–99)
Glucose-Capillary: 124 mg/dL — ABNORMAL HIGH (ref 70–99)
Glucose-Capillary: 102 mg/dL — ABNORMAL HIGH (ref 70–99)

## 2022-03-25 LAB — MAGNESIUM: Magnesium: 2.3 mg/dL (ref 1.7–2.4)

## 2022-03-25 LAB — PHOSPHORUS: Phosphorus: 3.4 mg/dL (ref 2.5–4.6)

## 2022-03-25 MED ORDER — GUAIFENESIN ER 600 MG PO TB12
600.0000 mg | ORAL_TABLET | Freq: Two times a day (BID) | ORAL | Status: DC
  Administered 2022-03-25 – 2022-03-27 (×4): 600 mg via ORAL
  Filled 2022-03-25 (×4): qty 1

## 2022-03-25 NOTE — Progress Notes (Signed)
PROGRESS NOTE    Frederick Brewer  VOJ:[id number] DOB: [birth year] DOA: 03/21/2022 PCP: Kirk Ruths, MD     Brief Narrative:  H/o COPD, transfer from [place] to [facility] intubated due to COPD exacerbation/RSV infection, activated  on 03/23/2022, transferred to hospitalist service on 1/3   Subjective:  Currently on 4 L oxygen supplement, intermittent productive cough, report not on home oxygen at baseline Denies pain   Assessment & Plan:  Principal Problem:   Acute respiratory failure with hypoxia ([place])    Assessment and Plan:   COPD exacerbation/RSV infection/acute respiratory failure -report quit smoking sometime last year in 2023 due to feeling sob -Required intubation initially, extubated, -Continue steroid, nebs, wean oxygen as able  Leukocytosis Possibly from steroid, monitor   Hyponatremia Improved with hydration, continue for another day  Impaired fasting blood glucose Possibly from steroid Follow-up with PCP   The patient's BMI is: Body mass index is 14.73 kg/m..    .  I have Reviewed nursing notes, Vitals, pain scores, I/o's, Lab results and  imaging results since pt's last encounter, details please see discussion above  I ordered the following labs:  Unresulted Labs (From admission, onward)     Start     Ordered   03/25/22 9937  Basic metabolic panel  ONCE - STAT,   STAT       Question:  Specimen collection method  Answer:  Lab=Lab collect   03/25/22 0831   03/25/22 0832  Magnesium  Once,   R       Question:  Specimen collection method  Answer:  Lab=Lab collect   03/25/22 0831   03/25/22 0832  Phosphorus  Once,   R       Question:  Specimen collection method  Answer:  Lab=Lab collect   03/25/22 0831   03/25/22 0831  CBC with Differential/Platelet  ONCE - STAT,   STAT       Question:  Specimen collection method  Answer:  Lab=Lab collect   03/25/22 0831             DVT prophylaxis: heparin injection 5,000 Units  Start: 03/21/22 2300   Code Status:   Code Status: Full Code  Family Communication: Patient Disposition:   Dispo: The patient is from: home, [place] farm              Anticipated d/c is to: Home with home health              Anticipated d/c date is: > 24hrs, wean oxygen  Antimicrobials:   Anti-infectives (From admission, onward)    Start     Dose/Rate Route Frequency Ordered Stop   03/25/22 1000  azithromycin (ZITHROMAX) tablet 500 mg        500 mg Oral  Once 03/24/22 1359 03/25/22 0850   03/22/22 0900  azithromycin (ZITHROMAX) 500 mg in sodium chloride 0.9 % 250 mL IVPB  Status:  Discontinued        500 mg 250 mL/hr over 60 Minutes Intravenous Daily 03/22/22 0802 03/24/22 1359           Objective: Vitals:   03/25/22 0500 03/25/22 0600 03/25/22 0657 03/25/22 0830  BP: (!) 110/52 (!) 116/59 (!) 126/54   Pulse: (!) 55 (!) 49 69   Resp: (!) 23 19 (!) 22   Temp:   97.9 F (36.6 C)   TempSrc:   Oral   SpO2: 97% 98% 91% 91%  Weight: 47.9 kg  Height:        Intake/Output Summary (Last 24 hours) at 03/25/2022 0835 Last data filed at 03/25/2022 0631 Gross per 24 hour  Intake 1370.74 ml  Output 2375 ml  Net -1004.26 ml   Filed Weights   03/23/22 1817 03/24/22 0500 03/25/22 0500  Weight: 71.2 kg 48.2 kg 47.9 kg    Examination:  General exam: alert, awake, communicative,calm, NAD Respiratory system: Very diminished, prolonged expiratory phase, respiratory effort normal. Cardiovascular system:  RRR.  Gastrointestinal system: Abdomen is nondistended, soft and nontender.  Normal bowel sounds heard. Central nervous system: Alert and oriented. No focal neurological deficits. Extremities:  no edema Skin: No rashes, lesions or ulcers Psychiatry: Judgement and insight appear normal. Mood & affect appropriate.     Data Reviewed: I have personally reviewed  labs and visualized  imaging studies since the last encounter and formulate the plan         Scheduled Meds:  arformoterol  15 mcg Nebulization Q12H   azithromycin  500 mg Oral Once   budesonide (PULMICORT) nebulizer solution  0.5 mg Nebulization BID   Chlorhexidine Gluconate Cloth  6 each Topical Q0600   docusate  100 mg Per Tube BID   famotidine  20 mg Per Tube BID   heparin  5,000 Units Subcutaneous Q8H   influenza vaccine adjuvanted  0.5 mL Intramuscular Tomorrow-1000   melatonin  5 mg Oral QHS   pneumococcal 20-valent conjugate vaccine  0.5 mL Intramuscular Tomorrow-1000   predniSONE  40 mg Oral Q breakfast   revefenacin  175 mcg Nebulization Daily   Continuous Infusions:  sodium chloride 50 mL/hr at 03/25/22 0631     LOS: 4 days     Florencia Reasons, MD PhD FACP [facility]  Available via Epic secure chat 7am-7pm for nonurgent issues Please page for urgent issues To page the attending provider between 7A-7P or the covering provider during after hours 7P-7A, please log into the web site [email or web address] and access using universal [facility] password for that web site. If you do not have the password, please call the hospital operator.    03/25/2022, 8:35 AM

## 2022-03-25 NOTE — Plan of Care (Signed)
  Problem: Education: Goal: Knowledge of General Education information will improve Description: Including pain rating scale, medication(s)/side effects and non-pharmacologic comfort measures Outcome: Progressing   Problem: Health Behavior/Discharge Planning: Goal: Ability to manage health-related needs will improve Outcome: Progressing   Problem: Clinical Measurements: Goal: Ability to maintain clinical measurements within normal limits will improve Outcome: Progressing Goal: Will remain free from infection Outcome: Progressing Goal: Diagnostic test results will improve Outcome: Progressing Goal: Respiratory complications will improve Outcome: Progressing Goal: Cardiovascular complication will be avoided Outcome: Progressing   Problem: Activity: Goal: Risk for activity intolerance will decrease Outcome: Progressing   Problem: Nutrition: Goal: Adequate nutrition will be maintained Outcome: Progressing   Problem: Coping: Goal: Level of anxiety will decrease Outcome: Progressing   Problem: Elimination: Goal: Will not experience complications related to bowel motility Outcome: Progressing Goal: Will not experience complications related to urinary retention Outcome: Progressing   Problem: Pain Managment: Goal: General experience of comfort will improve Outcome: Progressing   Problem: Safety: Goal: Ability to remain free from injury will improve Outcome: Progressing   Problem: Skin Integrity: Goal: Risk for impaired skin integrity will decrease Outcome: Progressing   Problem: Activity: Goal: Ability to tolerate increased activity will improve Outcome: Progressing   Problem: Respiratory: Goal: Ability to maintain a clear airway and adequate ventilation will improve Outcome: Progressing   Problem: Role Relationship: Goal: Method of communication will improve Outcome: Progressing   Problem: Safety: Goal: Non-violent Restraint(s) Outcome: Progressing   

## 2022-03-26 ENCOUNTER — Inpatient Hospital Stay (HOSPITAL_COMMUNITY): Payer: Medicare PPO

## 2022-03-26 DIAGNOSIS — J9601 Acute respiratory failure with hypoxia: Secondary | ICD-10-CM | POA: Diagnosis not present

## 2022-03-26 LAB — BASIC METABOLIC PANEL
Anion gap: 6 (ref 5–15)
BUN: 9 mg/dL (ref 8–23)
CO2: 32 mmol/L (ref 22–32)
Calcium: 8.4 mg/dL — ABNORMAL LOW (ref 8.9–10.3)
Chloride: 99 mmol/L (ref 98–111)
Creatinine, Ser: 0.59 mg/dL — ABNORMAL LOW (ref 0.61–1.24)
GFR, Estimated: >60 mL/min (ref >60)
Glucose, Bld: 80 mg/dL (ref 70–99)
Potassium: 4.1 mmol/L (ref 3.5–5.1)
Sodium: 137 mmol/L (ref 135–145)

## 2022-03-26 LAB — GLUCOSE, CAPILLARY
Glucose-Capillary: 90 mg/dL (ref 70–99)
Glucose-Capillary: 105 mg/dL — ABNORMAL HIGH (ref 70–99)
Glucose-Capillary: 113 mg/dL — ABNORMAL HIGH (ref 70–99)
Glucose-Capillary: 95 mg/dL (ref 70–99)

## 2022-03-26 MED ORDER — LIP MEDEX EX OINT
TOPICAL_OINTMENT | CUTANEOUS | Status: DC | PRN
  Filled 2022-03-26: qty 7

## 2022-03-26 MED ORDER — BUDESONIDE 0.25 MG/2ML IN SUSP
RESPIRATORY_TRACT | Status: AC
  Filled 2022-03-26: qty 2

## 2022-03-26 MED ORDER — NAPHAZOLINE-GLYCERIN 0.012-0.25 % OP SOLN
1.0000 [drp] | Freq: Four times a day (QID) | OPHTHALMIC | Status: DC | PRN
  Administered 2022-03-27: 1 [drp] via OPHTHALMIC
  Filled 2022-03-26: qty 15

## 2022-03-26 NOTE — TOC Initial Note (Signed)
Transition of Care [facility]) - Initial/Assessment Note    Patient Details  Name: Frederick Brewer MRN: [id number] Date of Birth: [birth year]  Transition of Care [facility]) CM/SW Contact:    Illene Regulus, LCSW Phone Number: 03/26/2022, 1:54 PM  Clinical Narrative:                 CSW spoke with patient to discuss [facility] services. Pt has no preference on agency. CSW reached out to corey with [facility], pt was accepted for HHPT/OT services. TOC to continue to follow for d/c needs.    Expected Discharge Plan: [place] Barriers to Discharge: Continued Medical Work up   Patient Goals and CMS Choice Patient states their goals for this hospitalization and ongoing recovery are:: [place] home CMS Medicare.gov Compare Post Acute Care list provided to:: Patient Choice offered to / list presented to : Patient      Expected Discharge Plan and Services       Living arrangements for the past 2 months: [place] Arranged: PT, OT HH Agency: [place] Date [facility] Agency Contacted: 03/26/22 Time [place]: 1250 Representative spoke with at [place]: Georgina Snell  Prior Living Arrangements/Services Living arrangements for the past 2 months: [place] Lives with:: Self Patient language and need for interpreter reviewed:: Yes Do you feel safe going back to the place where you live?: Yes      Need for Family Participation in Patient Care: No (Comment) Care giver support system in place?: No (comment)   Criminal Activity/Legal Involvement Pertinent to Current Situation/Hospitalization: No - Comment as needed  Activities of Daily Living Home Assistive Devices/Equipment: Dentures (specify type), Grab bars in shower (upper dentures) ADL Screening (condition at time of admission) Patient's cognitive ability adequate to safely complete daily activities?: Yes Is the patient deaf or have difficulty hearing?: No Does the  patient have difficulty seeing, even when wearing glasses/contacts?: No Does the patient have difficulty concentrating, remembering, or making decisions?: No Patient able to express need for assistance with ADLs?: Yes Does the patient have difficulty dressing or bathing?: No Independently performs ADLs?: Yes (appropriate for developmental age) Does the patient have difficulty walking or climbing stairs?: Yes Weakness of Legs: Both Weakness of Arms/Hands: Both  Permission Sought/Granted                  Emotional Assessment Appearance:: Appears stated age Attitude/Demeanor/Rapport: Gracious Affect (typically observed): Accepting Orientation: : Oriented to Self, Oriented to Place, Oriented to  Time, Oriented to Situation Alcohol / Substance Use: Not Applicable Psych Involvement: Yes (comment)  Admission diagnosis:  Acute respiratory failure with hypoxia ([place]) [J96.01] Patient Active Problem List   Diagnosis Date Noted   Acute respiratory failure with hypoxia and hypercapnia ([place]) 03/21/2022   RSV (respiratory syncytial virus pneumonia) 03/21/2022   Acute respiratory failure with hypoxemia ([place]) 03/21/2022   Acute respiratory failure with hypoxia ([place]) 03/21/2022   COPD exacerbation ([place]) 03/20/2022   PCP:  Kirk Ruths, MD Pharmacy:   [id number] - [place], [place] - [address] [place] [address] Phone: [phone] Fax: [phone]     Social Determinants of Health (SDOH) Social History: SDOH Screenings   Food Insecurity: No Food Insecurity (03/23/2022)  Housing: Low Risk  (03/23/2022)  Transportation Needs: No  Transportation Needs (03/23/2022)  Utilities: Not At Risk (03/23/2022)  Tobacco Use: High Risk (03/23/2022)   SDOH Interventions:     Readmission Risk Interventions     No data to display

## 2022-03-26 NOTE — Care Management Important Message (Signed)
Important Message  Patient Details IM Letter given. Name: Frederick Brewer MRN: [id number] Date of Birth: December 24, 1950   Medicare Important Message Given:  Yes     Kerin Salen 03/26/2022, 2:34 PM

## 2022-03-26 NOTE — Progress Notes (Signed)
Physical Therapy Treatment Patient Details Name: Frederick Brewer MRN: [id number] DOB: 1950-10-02 Today's Date: 03/26/2022   History of Present Illness Patient is a 72 year old male who presented to the ER on 12/29 with wheezing, cough, sputum, and worsening dyspnea. Patient was noted to have significant respiratory distress requiring intubation and mechanical ventilation transitioned to [facility] SDU. Patient was extubated on 1/1. Patient also noted to have acute hypoxic and hypercarbic respiratory failure, acute exacerbation of COPD,and  RSV. [id number]    PT Comments    Pt very eager to ambulate and assisted with ambulating in hallway with RW.  Pt required supplemental oxygen.  Pt encouraged to ambulate again later today with staff as tolerated. MD with pt end of session.  SATURATION QUALIFICATIONS: (This note is used to comply with regulatory documentation for home oxygen)  Patient Saturations on Room Air at Rest = 93%  Patient Saturations on Room Air while Ambulating = 86%  Patient Saturations on 2 Liters of oxygen while Ambulating = 93%  Please briefly explain why patient needs home oxygen: to improve oxygen saturations with physical activity such as ADLs and ambulation.     Recommendations for follow up therapy are one component of a multi-disciplinary discharge planning process, led by the attending physician.  Recommendations may be updated based on patient status, additional functional criteria and insurance authorization.  Follow Up Recommendations  Home health PT     Assistance Recommended at Discharge Frequent or constant Supervision/Assistance  Patient can return home with the following A little help with bathing/dressing/bathroom;Assistance with cooking/housework;Assist for transportation;Help with stairs or ramp for entrance;A little help with walking and/or transfers   Equipment Recommendations  Rolling walker (2 wheels)    Recommendations for Other Services        Precautions / Restrictions Precautions Precautions: Fall Precaution Comments: monitor O2     Mobility  Bed Mobility               General bed mobility comments: pt on BSC on arrival and then left in recliner    Transfers Overall transfer level: Needs assistance Equipment used: None Transfers: Sit to/from Stand Sit to Stand: Min guard           General transfer comment: min/guard for safety however no physical assist required, pt reliant on UE self assist    Ambulation/Gait Ambulation/Gait assistance: Min guard Gait Distance (Feet): 200 Feet Assistive device: Rolling walker (2 wheels) Gait Pattern/deviations: Step-through pattern, Decreased stride length, Trunk flexed Gait velocity: decr     General Gait Details: verbal cues for RW positioning and posture, cues for pursed lip breathing, denies dyspnea, required supplemental oxygen   Stairs             Wheelchair Mobility    Modified Rankin (Stroke Patients Only)       Balance                                            Cognition Arousal/Alertness: Awake/alert Behavior During Therapy: WFL for tasks assessed/performed Overall Cognitive Status: Within Functional Limits for tasks assessed                                          Exercises      General Comments  Pertinent Vitals/Pain Pain Assessment Pain Assessment: No/denies pain    Home Living                          Prior Function            PT Goals (current goals can now be found in the care plan section) Progress towards PT goals: Progressing toward goals    Frequency    Min 3X/week      PT Plan Current plan remains appropriate    Co-evaluation              AM-PAC PT "6 Clicks" Mobility   Outcome Measure  Help needed turning from your back to your side while in a flat bed without using bedrails?: A Little Help needed moving from lying on your back to  sitting on the side of a flat bed without using bedrails?: A Little Help needed moving to and from a bed to a chair (including a wheelchair)?: A Little Help needed standing up from a chair using your arms (e.g., wheelchair or bedside chair)?: A Little Help needed to walk in hospital room?: A Little Help needed climbing 3-5 steps with a railing? : A Little 6 Click Score: 18    End of Session Equipment Utilized During Treatment: Gait belt Activity Tolerance: Patient tolerated treatment well Patient left: in chair;with call bell/phone within reach (with MD) Nurse Communication: Mobility status PT Visit Diagnosis: Muscle weakness (generalized) (M62.81);Difficulty in walking, not elsewhere classified (R26.2)     Time: 2376-2831 PT Time Calculation (min) (ACUTE ONLY): 23 min  Charges:  $Gait Training: 23-37 mins                     Jannette Spanner PT, DPT Physical Therapist Acute Rehabilitation Services Preferred contact method: Secure Chat Weekend Pager Only: [phone] Office: [place] 03/26/2022, 3:02 PM

## 2022-03-26 NOTE — Progress Notes (Addendum)
PROGRESS NOTE    Frederick Brewer  UVO:[id number] DOB: 1951/03/14 DOA: 03/21/2022 PCP: Kirk Ruths, MD     Brief Narrative:  H/o COPD, transfer from [place] to [facility] care intubated due to COPD exacerbation/RSV infection, activated  on 03/23/2022, transferred to hospitalist service on 1/3   Subjective:  Currently on 2 L oxygen supplement, less cough, denies pain, no fever    Assessment & Plan:  Principal Problem:   Acute respiratory failure with hypoxia ([place])    Assessment and Plan:   COPD exacerbation/RSV infection/acute respiratory failure, not on home o2 at baseline -report quit smoking sometime last year in 2023 due to feeling sob -Required intubation initially, extubated, -Continue steroid, nebs, wean oxygen as able, check ct chest  ( 63yr + smoking, quit several months ago)  Leukocytosis, resolved Possibly from steroid/ stress/dehydration  Impaired fasting blood glucose, resolved Possibly from steroid and stress Follow-up with PCP  Hyponatremia, resolved Improved with hydration, DC hydration, encourage oral intake     The patient's BMI is: Body mass index is 14.73 kg/m..    .  I have Reviewed nursing notes, Vitals, pain scores, I/o's, Lab results and  imaging results since pt's last encounter, details please see discussion above  I ordered the following labs:  Unresulted Labs (From admission, onward)     Start     Ordered   03/26/22 0500  Hemoglobin A1c  Tomorrow morning,   R       Question:  Specimen collection method  Answer:  Lab=Lab collect   03/25/22 1725             DVT prophylaxis: heparin injection 5,000 Units Start: 03/21/22 2300   Code Status:   Code Status: Full Code  Family Communication: Patient Disposition:   Dispo: The patient is from: home, [place]              Anticipated d/c is to: Home with home health, F[facility] and [place] order placed              Anticipated d/c date is: likely on 1/5 ,  likely will need home oxygen  Antimicrobials:   Anti-infectives (From admission, onward)    Start     Dose/Rate Route Frequency Ordered Stop   03/25/22 1000  azithromycin (ZITHROMAX) tablet 500 mg        500 mg Oral  Once 03/24/22 1359 03/25/22 0850   03/22/22 0900  azithromycin (ZITHROMAX) 500 mg in sodium chloride 0.9 % 250 mL IVPB  Status:  Discontinued        500 mg 250 mL/hr over 60 Minutes Intravenous Daily 03/22/22 0802 03/24/22 1359           Objective: Vitals:   03/25/22 1157 03/25/22 1948 03/26/22 0628 03/26/22 1244  BP: 124/61 (!) 140/72 126/62 (!) 147/70  Pulse: 66 68 (!) 58 70  Resp: 16 20 17 16   Temp: 97.7 F (36.5 C) 98.5 F (36.9 C) 98 F (36.7 C) 98.2 F (36.8 C)  TempSrc: Oral Oral Oral Oral  SpO2: 96% 92% 100% 92%  Weight:      Height:        Intake/Output Summary (Last 24 hours) at 03/26/2022 1426 Last data filed at 03/26/2022 1300 Gross per 24 hour  Intake 2221.27 ml  Output 4950 ml  Net -2728.73 ml   Filed Weights   03/23/22 1817 03/24/22 0500 03/25/22 0500  Weight: 71.2 kg 48.2 kg 47.9 kg  Examination:  General exam: alert, awake, communicative,calm, NAD Respiratory system: Very diminished, prolonged expiratory phase, respiratory effort normal. Cardiovascular system:  RRR.  Gastrointestinal system: Abdomen is nondistended, soft and nontender.  Normal bowel sounds heard. Central nervous system: Alert and oriented. No focal neurological deficits. Extremities:  no edema Skin: No rashes, lesions or ulcers Psychiatry: Judgement and insight appear normal. Mood & affect appropriate.     Data Reviewed: I have personally reviewed  labs and visualized  imaging studies since the last encounter and formulate the plan        Scheduled Meds:  arformoterol  15 mcg Nebulization Q12H   budesonide       budesonide (PULMICORT) nebulizer solution  0.5 mg Nebulization BID   Chlorhexidine Gluconate Cloth  6 each Topical Q0600   docusate  100 mg  Per Tube BID   famotidine  20 mg Per Tube BID   guaiFENesin  600 mg Oral BID   heparin  5,000 Units Subcutaneous Q8H   melatonin  5 mg Oral QHS   predniSONE  40 mg Oral Q breakfast   revefenacin  175 mcg Nebulization Daily   Continuous Infusions:     LOS: 5 days     Florencia Reasons, MD PhD FACP [facility]  Available via Epic secure chat 7am-7pm for nonurgent issues Please page for urgent issues To page the attending provider between 7A-7P or the covering provider during after hours 7P-7A, please log into the web site [email or web address] and access using universal [place] password for that web site. If you do not have the password, please call the hospital operator.    03/26/2022, 2:26 PM

## 2022-03-27 ENCOUNTER — Encounter (HOSPITAL_COMMUNITY): Payer: Self-pay | Admitting: Pulmonary Disease

## 2022-03-27 ENCOUNTER — Other Ambulatory Visit: Payer: Self-pay

## 2022-03-27 DIAGNOSIS — J9601 Acute respiratory failure with hypoxia: Secondary | ICD-10-CM | POA: Diagnosis not present

## 2022-03-27 LAB — HEMOGLOBIN A1C
Hgb A1c MFr Bld: 6 % — ABNORMAL HIGH (ref 4.8–5.6)
Mean Plasma Glucose: 126 mg/dL

## 2022-03-27 LAB — GLUCOSE, CAPILLARY
Glucose-Capillary: 87 mg/dL (ref 70–99)
Glucose-Capillary: 92 mg/dL (ref 70–99)

## 2022-03-27 MED ORDER — AZITHROMYCIN 250 MG PO TABS: ORAL_TABLET | ORAL | 0 refills | Status: AC

## 2022-03-27 MED ORDER — GUAIFENESIN ER 600 MG PO TB12: 600.0000 mg | ORAL_TABLET | Freq: Two times a day (BID) | ORAL | Status: DC

## 2022-03-27 MED ORDER — CLONAZEPAM 0.5 MG PO TABS: 0.5000 mg | ORAL_TABLET | Freq: Two times a day (BID) | ORAL | 0 refills | Status: DC | PRN

## 2022-03-27 MED ORDER — PNEUMOCOCCAL 20-VAL CONJ VACC 0.5 ML IM SUSY
0.5000 mL | PREFILLED_SYRINGE | INTRAMUSCULAR | Status: AC
  Administered 2022-03-27: 0.5 mL via INTRAMUSCULAR
  Filled 2022-03-27 (×2): qty 0.5

## 2022-03-27 MED ORDER — INFLUENZA VAC A&B SA ADJ QUAD 0.5 ML IM PRSY
0.5000 mL | PREFILLED_SYRINGE | INTRAMUSCULAR | Status: AC
  Administered 2022-03-27: 0.5 mL via INTRAMUSCULAR
  Filled 2022-03-27: qty 0.5

## 2022-03-27 MED ORDER — ARFORMOTEROL TARTRATE 15 MCG/2ML IN NEBU: 15.0000 ug | INHALATION_SOLUTION | Freq: Two times a day (BID) | RESPIRATORY_TRACT | 0 refills | Status: DC

## 2022-03-27 MED ORDER — PREDNISONE 10 MG PO TABS: ORAL_TABLET | ORAL | 0 refills | Status: DC

## 2022-03-27 MED ORDER — FAMOTIDINE 20 MG PO TABS: 20.0000 mg | ORAL_TABLET | Freq: Every day | ORAL | 0 refills | Status: DC

## 2022-03-27 MED ORDER — ALBUTEROL SULFATE HFA 108 (90 BASE) MCG/ACT IN AERS: 2.0000 | INHALATION_SPRAY | Freq: Four times a day (QID) | RESPIRATORY_TRACT | 2 refills | Status: AC | PRN

## 2022-03-27 MED ORDER — BUDESONIDE 0.5 MG/2ML IN SUSP: 0.5000 mg | Freq: Two times a day (BID) | RESPIRATORY_TRACT | 0 refills | Status: DC

## 2022-03-27 NOTE — Progress Notes (Signed)
SATURATION QUALIFICATIONS: (This note is used to comply with regulatory documentation for home oxygen)  Patient Saturations on Room Air at Rest = 95%%  Patient Saturations on Room Air while Ambulating = 91 %  Patient Saturations on 0 Liters of oxygen while Ambulating = 91%  Please briefly explain why patient needs home oxygen: The patient is stable upon ambulation, Saturation sustained 91-92%.

## 2022-03-27 NOTE — Progress Notes (Signed)
Patient ambulated in the hallway with assistance from NT for oxygen saturation stats. On room air patients oxygen while walking 116ft was 92. When returned to the room and sitting it was 95.

## 2022-03-27 NOTE — Discharge Summary (Addendum)
Discharge Summary  Frederick Brewer [id number] DOB: 1950-10-14  PCP: Kirk Ruths, MD  Admit date: 03/21/2022 Discharge date: 03/27/2022   Time spent: 21mins, more than 50% time spent on coordination of care.   Recommendations for Outpatient Follow-up:  F/u with PCP within a week  for hospital discharge follow up, repeat cbc/bmp at follow up, pcp to refer you to a pulmonologist for outpatient lung function test Home health arranged     Discharge Diagnoses:  Active Hospital Problems   Diagnosis Date Noted   Acute respiratory failure with hypoxia ([place]) 03/21/2022    Resolved Hospital Problems  No resolved problems to display.    Discharge Condition: stable  Diet recommendation: regular diet  Filed Weights   03/23/22 1817 03/24/22 0500 03/25/22 0500  Weight: 71.2 kg 48.2 kg 47.9 kg    History of present illness: ( per ICU admitting MD Dr Elsworth Soho)  72 y.o. male admitted with Acute Hypoxic & Hypercapnic Respiratory Failure in the setting of Acute COPD Exacerbation due to RSV infection. Failed trial of BiPAP requiring intubation and mechanical ventilation  He was transferred from [facility] ED since no beds were available to admit there. He was admitted by the PCCM team there, initial VBG on BiPAP was 7.28/80 He was started on IV Solu-Medrol 60 every 12 and empiric Levaquin.  He was sedated with propofol and fentanyl.  Chest x-ray did not show any infiltrates  Hospital Course:  Principal Problem:   Acute respiratory failure with hypoxia (HCC)   Assessment and Plan:   COPD exacerbation/RSV infection/acute respiratory failure /Required intubation initially -report quit smoking sometime last year in 2023 due to feeling sob -he is treated with steroid, nebs, has improved, walked on room air, o2 sats above 90%  - ct chest  showed severe emphysema and bronchitis changes ( 63yr + smoking, quit several months ago), d/c with prednisone taper and zpack -f/u with pcp and  pulmonology to continue optimize copd maintenance meds  Leukocytosis, resolved Possibly from steroid/ stress/dehydration   Impaired fasting blood glucose, resolved Possibly from steroid and stress Follow-up with PCP   Hyponatremia, resolved Improved with hydration, DC hydration, encourage oral intake  Incidental findings on CT chest: Coronary artery disease.  F/u with pcp   Discharge Exam: BP 136/76 (BP Location: Right Arm)   Pulse 71   Temp 97.9 F (36.6 C) (Oral)   Resp 16   Ht 5\' 11"  (1.803 m)   Wt 47.9 kg   SpO2 97%   BMI 14.73 kg/m   General: NAD Cardiovascular: RRR Respiratory: overall very diminished, normal respiratory effort    Discharge Instructions     Diet general   Complete by: As directed    Increase activity slowly   Complete by: As directed    No wound care   Complete by: As directed       Allergies as of 03/27/2022       Reactions   Penicillins Rash        Medication List     STOP taking these medications    acetaminophen 325 MG tablet Commonly known as: TYLENOL   chlorpheniramine-HYDROcodone 10-8 MG/5ML Commonly known as: TUSSIONEX   docusate 50 MG/5ML liquid Commonly known as: COLACE   enoxaparin 40 MG/0.4ML injection Commonly known as: LOVENOX   fentaNYL 10 mcg/ml Soln infusion   fentaNYL Soln Commonly known as: SUBLIMAZE   ipratropium-albuterol 0.5-2.5 (3) MG/3ML Soln Commonly known as: DUONEB   levofloxacin 750 MG/150ML Soln Commonly known as: LEVAQUIN  magnesium hydroxide 400 MG/5ML suspension Commonly known as: MILK OF MAGNESIA   methylPREDNISolone sodium succinate 125 mg/2 mL injection Commonly known as: SOLU-MEDROL   midazolam 2 MG/2ML Soln injection Commonly known as: VERSED   ondansetron 4 MG tablet Commonly known as: ZOFRAN   pantoprazole 40 MG injection Commonly known as: PROTONIX   polyethylene glycol 17 g packet Commonly known as: MIRALAX / GLYCOLAX   propofol 1000 MG/100ML Emul  injection Commonly known as: DIPRIVAN   sodium chloride 0.9 % infusion   traZODone 50 MG tablet Commonly known as: DESYREL       TAKE these medications    albuterol 108 (90 Base) MCG/ACT inhaler Commonly known as: VENTOLIN HFA Inhale 2 puffs into the lungs every 6 (six) hours as needed for wheezing or shortness of breath.   arformoterol 15 MCG/2ML Nebu Commonly known as: BROVANA Take 2 mLs (15 mcg total) by nebulization every 12 (twelve) hours.   azithromycin 250 MG tablet Commonly known as: Zithromax Z-Pak Take 2 tablets (500 mg) on  Day 1,  followed by 1 tablet (250 mg) once daily on Days 2 through 5.   budesonide 0.5 MG/2ML nebulizer solution Commonly known as: PULMICORT Take 2 mLs (0.5 mg total) by nebulization 2 (two) times daily.   clonazePAM 0.5 MG tablet Commonly known as: KlonoPIN Take 1 tablet (0.5 mg total) by mouth 2 (two) times daily as needed for up to 7 days for anxiety.   famotidine 20 MG tablet Commonly known as: PEPCID Place 1 tablet (20 mg total) into feeding tube daily for 7 days.   guaiFENesin 600 MG 12 hr tablet Commonly known as: MUCINEX Take 1 tablet (600 mg total) by mouth 2 (two) times daily.   ibuprofen 200 MG tablet Commonly known as: ADVIL Take 200 mg by mouth every 6 (six) hours as needed for mild pain.   melatonin 3 MG Tabs tablet Take 3 mg by mouth at bedtime.   predniSONE 10 MG tablet Commonly known as: DELTASONE Label  & dispense according to the schedule below.   Please take with breakfast 3Pills PO on day one, 2 Pills PO on day two, 1 Pills PO on day three,  then STOP.  Total of 6 tabs       Allergies  Allergen Reactions   Penicillins Rash    Follow-up Information     Kirk Ruths, MD Follow up in 1 week(s).   Specialty: Internal Medicine Why: hospital discharge follow up, pcp to refer you to a pulmonologist to have lung function test Contact information: [place] [address] [place], [facility] Follow up.   Specialty: [place] Why: will contact you 24 to 48 hrs after d/c Contact information: [place] [address] [phone]                  The results of significant diagnostics from this hospitalization (including imaging, microbiology, ancillary and laboratory) are listed below for reference.    Significant Diagnostic Studies: CT CHEST WO CONTRAST  Result Date: 03/26/2022 CLINICAL DATA:  COPD exacerbation, smoker EXAM: CT CHEST WITHOUT CONTRAST TECHNIQUE: Multidetector CT imaging of the chest was performed following the standard protocol without IV contrast. RADIATION DOSE REDUCTION: This exam was performed according to the departmental dose-optimization program which includes automated exposure control, adjustment of the mA and/or kV according to patient size and/or  use of iterative reconstruction technique. COMPARISON:  None Available. FINDINGS: Cardiovascular: Aortic atherosclerosis. Normal heart size. Left coronary artery calcifications. No pericardial effusion. Mediastinum/Nodes: No enlarged mediastinal, hilar, or axillary lymph nodes. Thyroid gland, trachea, and esophagus demonstrate no significant findings. Lungs/Pleura: Severe emphysema. Diffuse bilateral bronchial wall thickening. Dependent bibasilar scarring and or atelectasis. No pleural effusion or pneumothorax. Upper Abdomen: No acute abnormality. Musculoskeletal: No chest wall abnormality. No acute osseous findings. IMPRESSION: 1. Severe emphysema. 2. Diffuse bilateral bronchial wall thickening, consistent with nonspecific infectious or inflammatory bronchitis. 3. Coronary artery disease. Aortic Atherosclerosis (ICD10-I70.0) and Emphysema (ICD10-J43.9). Electronically Signed   By: Delanna Ahmadi M.D.   On: 03/26/2022 19:11   Portable Chest x-ray  Result Date: 03/21/2022 CLINICAL DATA:  Endotracheal  tube placement EXAM: PORTABLE CHEST 1 VIEW COMPARISON:  Film from earlier in the same day. FINDINGS: Endotracheal tube is noted in satisfactory position 4 cm above the carina. Gastric catheter extends to the stomach. No focal infiltrate or effusion is seen. No bony abnormality is noted. IMPRESSION: Endotracheal tube and gastric catheter in satisfactory position. Electronically Signed   By: Inez Catalina M.D.   On: 03/21/2022 22:34   DG Abd Portable 1V  Result Date: 03/21/2022 CLINICAL DATA:  [id number] Encounter for orogastric (OG) tube placement [id number] EXAM: PORTABLE ABDOMEN - 1 VIEW COMPARISON:  Chest x-ray 03/20/2022, chest x-ray 03/21/2022 FINDINGS: Enteric tube coursing below the hemidiaphragm with tip overlying the expected region of the gastric lumen and side port in the region of the gastroesophageal junction. Temperature probe noted overlying the pelvis. The bowel gas pattern is normal. No radio-opaque calculi or other significant radiographic abnormality are seen. Please note the right abdomen is collimated off view. Trace left pleural effusion. IMPRESSION: 1. Enteric tube coursing below the hemidiaphragm with tip overlying the expected region of the gastric lumen and side port in the region of the gastroesophageal junction. Consider advancing by 2 cm. 2. Nonobstructive bowel gas pattern. 3. Please note the right abdomen is collimated off view. 4. Trace left pleural effusion. Electronically Signed   By: Iven Finn M.D.   On: 03/21/2022 17:08   DG Chest Port 1 View  Result Date: 03/21/2022 CLINICAL DATA:  Intubated. EXAM: PORTABLE CHEST 1 VIEW COMPARISON:  03/20/2022.  Chest CT dated 12/12/2010. FINDINGS: Interval endotracheal tube in satisfactory position. Interval nasogastric tube with its tip and side hole in the proximal stomach. Normal sized heart. Mildly tortuous and calcified thoracic aorta. Stable changes of COPD. IMPRESSION: 1. Endotracheal tube in satisfactory position. 2. Stable changes  of COPD. 3. No acute abnormality. Electronically Signed   By: Claudie Revering M.D.   On: 03/21/2022 14:43   DG Chest Portable 1 View  Result Date: 03/20/2022 CLINICAL DATA:  Shortness of breath. EXAM: PORTABLE CHEST 1 VIEW COMPARISON:  CT chest dated December 12, 2010 FINDINGS: The heart size and mediastinal contours are within normal limits. Advanced emphysematous changes with large emphysematous bulla in the bilateral upper lobes. No evidence of pneumothorax. No focal consolidation or large pleural effusion. The visualized skeletal structures are unremarkable. IMPRESSION: Advanced emphysematous changes without evidence of acute cardiopulmonary process. Electronically Signed   By: Keane Police D.O.   On: 03/20/2022 23:12    Microbiology: Recent Results (from the past 240 hour(s))  Resp panel by RT-PCR (RSV, Flu A&B, Covid) Anterior Nasal Swab     Status: Abnormal   Collection Time: 03/20/22 11:09 PM   Specimen: Anterior Nasal Swab  Result Value Ref Range Status   SARS  Coronavirus 2 by RT PCR NEGATIVE NEGATIVE Final    Comment: (NOTE) SARS-CoV-2 target nucleic acids are NOT DETECTED.  The SARS-CoV-2 RNA is generally detectable in upper respiratory specimens during the acute phase of infection. The lowest concentration of SARS-CoV-2 viral copies this assay can detect is 138 copies/mL. A negative result does not preclude SARS-Cov-2 infection and should not be used as the sole basis for treatment or other patient management decisions. A negative result may occur with  improper specimen collection/handling, submission of specimen other than nasopharyngeal swab, presence of viral mutation(s) within the areas targeted by this assay, and inadequate number of viral copies(<138 copies/mL). A negative result must be combined with clinical observations, patient history, and epidemiological information. The expected result is Negative.  Fact Sheet for Patients:   EntrepreneurPulse.com.au  Fact Sheet for Healthcare Providers:  IncredibleEmployment.be  This test is no t yet approved or cleared by the [place] FDA and  has been authorized for detection and/or diagnosis of SARS-CoV-2 by FDA under an Emergency Use Authorization (EUA). This EUA will remain  in effect (meaning this test can be used) for the duration of the COVID-19 declaration under Section 564(b)(1) of the Act, 21 U.S.C.section 360bbb-3(b)(1), unless the authorization is terminated  or revoked sooner.       Influenza A by PCR NEGATIVE NEGATIVE Final   Influenza B by PCR NEGATIVE NEGATIVE Final    Comment: (NOTE) The Xpert Xpress SARS-CoV-2/FLU/RSV plus assay is intended as an aid in the diagnosis of influenza from Nasopharyngeal swab specimens and should not be used as a sole basis for treatment. Nasal washings and aspirates are unacceptable for Xpert Xpress SARS-CoV-2/FLU/RSV testing.  Fact Sheet for Patients: EntrepreneurPulse.com.au  Fact Sheet for Healthcare Providers: IncredibleEmployment.be  This test is not yet approved or cleared by the [place] FDA and has been authorized for detection and/or diagnosis of SARS-CoV-2 by FDA under an Emergency Use Authorization (EUA). This EUA will remain in effect (meaning this test can be used) for the duration of the COVID-19 declaration under Section 564(b)(1) of the Act, 21 U.S.C. section 360bbb-3(b)(1), unless the authorization is terminated or revoked.     Resp Syncytial Virus by PCR POSITIVE (A) NEGATIVE Final    Comment: (NOTE) Fact Sheet for Patients: EntrepreneurPulse.com.au  Fact Sheet for Healthcare Providers: IncredibleEmployment.be  This test is not yet approved or cleared by the [place] FDA and has been authorized for detection and/or diagnosis of SARS-CoV-2 by FDA under an Emergency Use  Authorization (EUA). This EUA will remain in effect (meaning this test can be used) for the duration of the COVID-19 declaration under Section 564(b)(1) of the Act, 21 U.S.C. section 360bbb-3(b)(1), unless the authorization is terminated or revoked.  Performed at [facility], [address] [place], [place] [address]   MRSA Next Gen by PCR, Nasal     Status: None   Collection Time: 03/21/22  9:54 PM   Specimen: Nasal Mucosa; Nasal Swab  Result Value Ref Range Status   MRSA by PCR Next Gen NOT DETECTED NOT DETECTED Final    Comment: (NOTE) The GeneXpert MRSA Assay (FDA approved for NASAL specimens only), is one component of a comprehensive MRSA colonization surveillance program. It is not intended to diagnose MRSA infection nor to guide or monitor treatment for MRSA infections. Test performance is not FDA approved in patients less than 50 years old. Performed at [facility], [place] [address], [place], [place] [address]   Culture, Respiratory w Gram  Stain     Status: None   Collection Time: 03/21/22 11:38 PM   Specimen: Tracheal Aspirate; Respiratory  Result Value Ref Range Status   Specimen Description   Final    TRACHEAL ASPIRATE Performed at [place] [address] [place], [place] [address]    Special Requests   Final    NONE Performed at [facility], [place] [address] [place], [place] [address]    Gram Stain   Final    FEW WBC PRESENT, PREDOMINANTLY PMN NO ORGANISMS SEEN    Culture   Final    RARE Normal respiratory flora-no Staph aureus or Pseudomonas seen Performed at [facility] [address], [place], [place] [address]    Report Status 03/24/2022 FINAL  Final  Respiratory (~20 pathogens) panel by PCR     Status: Abnormal   Collection Time: 03/21/22 11:53 PM   Specimen: Nasopharyngeal Swab; Respiratory  Result Value Ref Range Status   Adenovirus NOT DETECTED NOT DETECTED Final    Coronavirus 229E NOT DETECTED NOT DETECTED Final    Comment: (NOTE) The Coronavirus on the Respiratory Panel, DOES NOT test for the novel  Coronavirus (2019 nCoV)    Coronavirus HKU1 NOT DETECTED NOT DETECTED Final   Coronavirus NL63 NOT DETECTED NOT DETECTED Final   Coronavirus OC43 NOT DETECTED NOT DETECTED Final   Metapneumovirus NOT DETECTED NOT DETECTED Final   Rhinovirus / Enterovirus NOT DETECTED NOT DETECTED Final   Influenza A NOT DETECTED NOT DETECTED Final   Influenza B NOT DETECTED NOT DETECTED Final   Parainfluenza Virus 1 NOT DETECTED NOT DETECTED Final   Parainfluenza Virus 2 NOT DETECTED NOT DETECTED Final   Parainfluenza Virus 3 NOT DETECTED NOT DETECTED Final   Parainfluenza Virus 4 NOT DETECTED NOT DETECTED Final   Respiratory Syncytial Virus DETECTED (A) NOT DETECTED Final   Bordetella pertussis NOT DETECTED NOT DETECTED Final   Bordetella Parapertussis NOT DETECTED NOT DETECTED Final   Chlamydophila pneumoniae NOT DETECTED NOT DETECTED Final   Mycoplasma pneumoniae NOT DETECTED NOT DETECTED Final    Comment: Performed at [facility], [place] [address], [place], [place] [address]     Labs: Basic Metabolic Panel: Recent Labs  Lab 03/21/22 0416 03/22/22 0902 03/23/22 0322 03/25/22 0846 03/26/22 0420  NA 133* 135 134* 137 137  K 4.2 5.2* 4.9 3.9 4.1  CL 92* 98 101 96* 99  CO2 33* 28 29 33* 32  GLUCOSE 150* 132* 123* 98 80  BUN 14 27* 39* 13 9  CREATININE 0.70 1.05 0.84 0.63 0.59*  CALCIUM 8.7* 8.7* 8.1* 8.4* 8.4*  MG  --  2.7*  --  2.3  --   PHOS  --   --   --  3.4  --    Liver Function Tests: Recent Labs  Lab 03/20/22 2248  AST 36  ALT 26  ALKPHOS 68  BILITOT 0.7  PROT 7.9  ALBUMIN 4.1   No results for input(s): "LIPASE", "AMYLASE" in the last 168 hours. No results for input(s): "AMMONIA" in the last 168 hours. CBC: Recent Labs  Lab 03/20/22 2248 03/21/22 0416 03/22/22 0902 03/23/22 0322 03/23/22 1018 03/23/22 1634 03/25/22 0846   WBC 9.4   < > 10.5 9.8 11.2* 13.1* 9.7  NEUTROABS 5.9  --   --   --   --   --  6.4  HGB 15.5   < > 14.5 11.8* 12.4* 12.9* 12.6*  HCT 48.5   < >  46.2 36.7* 39.9 40.9 40.8  MCV 91.7   < > 95.9 94.6 95.0 95.8 95.6  PLT 251   < > 259 239 255 268 273   < > = values in this interval not displayed.   Cardiac Enzymes: No results for input(s): "CKTOTAL", "CKMB", "CKMBINDEX", "TROPONINI" in the last 168 hours. BNP: BNP (last 3 results) No results for input(s): "BNP" in the last 8760 hours.  ProBNP (last 3 results) No results for input(s): "PROBNP" in the last 8760 hours.  CBG: Recent Labs  Lab 03/26/22 1129 03/26/22 1608 03/26/22 2025 03/27/22 0727 03/27/22 1146  GLUCAP 105* 113* 95 87 92    FURTHER DISCHARGE INSTRUCTIONS:   Get Medicines reviewed and adjusted: Please take all your medications with you for your next visit with your Primary MD   Laboratory/radiological data: Please request your Primary MD to go over all hospital tests and procedure/radiological results at the follow up, please ask your Primary MD to get all Hospital records sent to his/her office.   In some cases, they will be blood work, cultures and biopsy results pending at the time of your discharge. Please request that your primary care M.D. goes through all the records of your hospital data and follows up on these results.   Also Note the following: If you experience worsening of your admission symptoms, develop shortness of breath, life threatening emergency, suicidal or homicidal thoughts you must seek medical attention immediately by calling 911 or calling your MD immediately  if symptoms less severe.   You must read complete instructions/literature along with all the possible adverse reactions/side effects for all the Medicines you take and that have been prescribed to you. Take any new Medicines after you have completely understood and accpet all the possible adverse reactions/side effects.    Do not  drive when taking Pain medications or sleeping medications (Benzodaizepines)   Do not take more than prescribed Pain, Sleep and Anxiety Medications. It is not advisable to combine anxiety,sleep and pain medications without talking with your primary care practitioner   Special Instructions: If you have smoked or chewed Tobacco  in the last 2 yrs please stop smoking, stop any regular Alcohol  and or any Recreational drug use.   Wear Seat belts while driving.   Please note: You were cared for by a hospitalist during your hospital stay. Once you are discharged, your primary care physician will handle any further medical issues. Please note that NO REFILLS for any discharge medications will be authorized once you are discharged, as it is imperative that you return to your primary care physician (or establish a relationship with a primary care physician if you do not have one) for your post hospital discharge needs so that they can reassess your need for medications and monitor your lab values.     Signed:  Florencia Reasons MD, PhD, FACP  [facility] 03/27/2022, 12:54 PM

## 2022-03-27 NOTE — Progress Notes (Signed)
Patients family member called [facility] [phone]) regarding patients prescription sent to [facility] for Budesonide. Family member states that RX was unable to be filled due to dosing. Hospitalist provider will follow up with [facility] [phone]).

## 2022-03-27 NOTE — Progress Notes (Signed)
The patient is stable, no change from am assessment. Pt is alert, oriented x4, ambulatory with walker. Discharge instructions were reviewed. Questions, concerns denied at this time.

## 2022-03-27 NOTE — Progress Notes (Signed)
Per M.D Fluarix and Prevnar to be given. Vaccines administered. Fluarix rt arm , Prevnar lt arm

## 2022-03-27 NOTE — TOC Transition Note (Signed)
Transition of Care [facility]) - CM/SW Discharge Note   Patient Details  Name: Frederick Brewer MRN: [id number] Date of Birth: [birth year]  [facility] CM/SW Contact:  Illene Regulus, LCSW Phone Number: 03/27/2022, 12:12 PM   Clinical Narrative:    HHPT/OT arranged with Alvis Lemmings no additional needs.    Final next level of care: Home w Home Health Services Barriers to Discharge: No Barriers Identified   Patient Goals and CMS Choice CMS Medicare.gov Compare Post Acute Care list provided to:: Patient Choice offered to / list presented to : Patient  Discharge Placement                         Discharge Plan and Services Additional resources added to the After Visit Summary for                            [facility] Arranged: PT, OT [facility] Agency: [place] Date [place]: 03/26/22 Time [place]: 1250 Representative spoke with at [place]: [place] Determinants of Health ([place]) Interventions SDOH Screenings   Food Insecurity: No Food Insecurity (03/23/2022)  Housing: Low Risk  (03/23/2022)  Transportation Needs: No Transportation Needs (03/23/2022)  Utilities: Not At Risk (03/23/2022)  Tobacco Use: High Risk (03/27/2022)     Readmission Risk Interventions     No data to display

## 2022-05-12 ENCOUNTER — Other Ambulatory Visit: Payer: Self-pay

## 2022-05-12 DIAGNOSIS — Z720 Tobacco use: Secondary | ICD-10-CM

## 2022-05-20 ENCOUNTER — Ambulatory Visit
Admission: RE | Admit: 2022-05-20 | Discharge: 2022-05-20 | Disposition: A | Discharge status: Home / Self Care | Payer: Medicare PPO | Source: Ambulatory Visit | Attending: Infectious Diseases | Admitting: Infectious Diseases

## 2022-05-20 DIAGNOSIS — Z136 Encounter for screening for cardiovascular disorders: Secondary | ICD-10-CM | POA: Diagnosis not present

## 2022-05-20 DIAGNOSIS — J439 Emphysema, unspecified: Secondary | ICD-10-CM | POA: Insufficient documentation

## 2022-05-20 DIAGNOSIS — Z720 Tobacco use: Secondary | ICD-10-CM

## 2022-05-20 DIAGNOSIS — Z87891 Personal history of nicotine dependence: Secondary | ICD-10-CM | POA: Insufficient documentation

## 2022-05-24 LAB — COLOGUARD: COLOGUARD: NEGATIVE

## 2023-09-20 NOTE — Progress Notes (Signed)
 Subjective:   CC: Epidermal cyst [L72.0]  HPI:  referred by Jonette Penning Minor,* for evaluation of above.   History of Present Illness Frederick Brewer is a 73 year old male who presents with an infected sebaceous cyst.  The sebaceous cyst has been present since approximately 1970 and has gradually increased in size. Recently, it became infected, leading to a visit to his primary care provider on September 03, 2023. He completed a course of doxycycline, which significantly improved redness and inflammation.  The cyst is located where a suspender-like strap meets, potentially contributing to irritation and infection. He carries a weapon that requires a strap, which may have exacerbated the issue. He is concerned about further enlargement and potential reinfection.    Past Medical History:  has a past medical history of COPD (chronic obstructive pulmonary disease) (CMS/HHS-HCC).  Past Surgical History:  has no past surgical history on file.  Family History: family history includes Muscular dystrophy in his sister; Prostate cancer in his father; Rheum arthritis in his mother.  Social History:  reports that he has quit smoking. His smoking use included cigarettes. He has quit using smokeless tobacco. He reports current alcohol use. No history on file for drug use.  Current Medications: has a current medication list which includes the following prescription(s): albuterol  mdi (proventil , ventolin , proair ) hfa, fluticasone propion-salmeterol, ibuprofen, ipratropium-albuterol , and melatonin.  Allergies:  Allergies  Allergen Reactions  . Roflumilast Rash  . Penicillins Rash    ROS:  A 15 point review of systems was performed and pertinent positives and negatives noted in HPI   Objective:     BP (!) 148/75   Pulse 88   Ht 180.3 cm (5' 11)   Wt 78.5 kg (173 lb)   BMI 24.13 kg/m   Constitutional :  No distress, cooperative, alert  Lymphatics/Throat:  Supple with no lymphadenopathy   Respiratory:  Clear to auscultation bilaterally  Cardiovascular:  Regular rate and rhythm  Gastrointestinal: Soft, non-tender, non-distended, no organomegaly.  Musculoskeletal: Steady gait and movement  Skin: Cool and moist, two epidermal cysts on back, adjacent to each other, no active infection  Psychiatric: Normal affect, non-agitated, not confused         LABS:  N/a   RADS: N/a  Assessment:      Epidermal cyst [L72.0] x2, back  Plan:     1. Epidermal cyst [L72.0] Discussed surgical excision.  Alternatives include continued observation.  Benefits include possible symptom relief, pathologic evaluation, improved cosmesis. Discussed the risk of surgery including recurrence, chronic pain, post-op infxn, poor cosmesis, poor/delayed wound healing, and possible re-operation to address said risks. The risks of general anesthetic, if used, includes MI, CVA, sudden death or even reaction to anesthetic medications also discussed.  Typical post-op recovery time of 3-5 days with possible activity restrictions were also discussed.  The patient verbalized understanding and all questions were answered to the patient's satisfaction.  2. Patient has elected to proceed with surgical treatment. Procedure will be scheduled. In office.  labs/images/medications/previous chart entries reviewed personally and relevant changes/updates noted above.

## 2023-09-30 ENCOUNTER — Encounter: Payer: Self-pay | Admitting: Ophthalmology

## 2023-10-05 ENCOUNTER — Encounter: Payer: Self-pay | Admitting: Ophthalmology

## 2023-10-05 NOTE — Anesthesia Preprocedure Evaluation (Addendum)
 Anesthesia Evaluation  Patient identified by MRN, date of birth, ID band Patient awake    Reviewed: Allergy & Precautions, H&P , NPO status , Patient's Chart, lab work & pertinent test results  Airway Mallampati: III  TM Distance: >3 FB Neck ROM: Full    Dental no notable dental hx. (+) Partial Upper   Pulmonary neg pulmonary ROS, pneumonia, COPD, former smoker Severe emphysema  Office note April 2025:  1. Advanced chronic obstructive pulmonary disease (CMS/HHS-HCC) Recent FEV1 25%.  Continue Advair BID and Duo neb PRN up to 4 times daily - he reports effectiveness and Mmrc is 2     Pulmonary exam normal breath sounds clear to auscultation       Cardiovascular + CAD  negative cardio ROS Normal cardiovascular exam Rhythm:Regular Rate:Normal     Neuro/Psych negative neurological ROS  negative psych ROS   GI/Hepatic negative GI ROS, Neg liver ROS,,,  Endo/Other  negative endocrine ROS    Renal/GU negative Renal ROS  negative genitourinary   Musculoskeletal negative musculoskeletal ROS (+)    Abdominal   Peds negative pediatric ROS (+)  Hematology negative hematology ROS (+)   Anesthesia Other Findings Severe emphysema FEF 25% CAD COPD Can't sit back, likes to sit straight up for his back  Reproductive/Obstetrics negative OB ROS                              Anesthesia Physical Anesthesia Plan  ASA: 3  Anesthesia Plan: MAC   Post-op Pain Management:    Induction: Intravenous  PONV Risk Score and Plan:   Airway Management Planned: Natural Airway and Nasal Cannula  Additional Equipment:   Intra-op Plan:   Post-operative Plan:   Informed Consent: I have reviewed the patients History and Physical, chart, labs and discussed the procedure including the risks, benefits and alternatives for the proposed anesthesia with the patient or authorized representative who has  indicated his/her understanding and acceptance.     Dental Advisory Given  Plan Discussed with: Anesthesiologist, CRNA and Surgeon  Anesthesia Plan Comments: (Patient consented for risks of anesthesia including but not limited to:  - adverse reactions to medications - damage to eyes, teeth, lips or other oral mucosa - nerve damage due to positioning  - sore throat or hoarseness - Damage to heart, brain, nerves, lungs, other parts of body or loss of life  Patient voiced understanding and assent.)         Anesthesia Quick Evaluation

## 2023-10-11 NOTE — Discharge Instructions (Signed)

## 2023-10-13 ENCOUNTER — Encounter
Admission: RE | Disposition: A | Discharge status: Home / Self Care | Payer: Self-pay | Source: Home / Self Care | Attending: Ophthalmology

## 2023-10-13 ENCOUNTER — Ambulatory Visit
Admission: RE | Admit: 2023-10-13 | Discharge: 2023-10-13 | Disposition: A | Discharge status: Home / Self Care | Attending: Ophthalmology | Admitting: Ophthalmology

## 2023-10-13 ENCOUNTER — Encounter: Payer: Self-pay | Admitting: Ophthalmology

## 2023-10-13 ENCOUNTER — Ambulatory Visit: Payer: Self-pay | Admitting: Anesthesiology

## 2023-10-13 ENCOUNTER — Other Ambulatory Visit: Payer: Self-pay

## 2023-10-13 DIAGNOSIS — J439 Emphysema, unspecified: Secondary | ICD-10-CM | POA: Insufficient documentation

## 2023-10-13 DIAGNOSIS — H25012 Cortical age-related cataract, left eye: Secondary | ICD-10-CM | POA: Diagnosis present

## 2023-10-13 DIAGNOSIS — I2510 Atherosclerotic heart disease of native coronary artery without angina pectoris: Secondary | ICD-10-CM | POA: Insufficient documentation

## 2023-10-13 DIAGNOSIS — H25042 Posterior subcapsular polar age-related cataract, left eye: Secondary | ICD-10-CM | POA: Diagnosis present

## 2023-10-13 DIAGNOSIS — H2512 Age-related nuclear cataract, left eye: Secondary | ICD-10-CM | POA: Insufficient documentation

## 2023-10-13 DIAGNOSIS — Z87891 Personal history of nicotine dependence: Secondary | ICD-10-CM | POA: Insufficient documentation

## 2023-10-13 DIAGNOSIS — Z7951 Long term (current) use of inhaled steroids: Secondary | ICD-10-CM | POA: Diagnosis not present

## 2023-10-13 HISTORY — DX: Atherosclerotic heart disease of native coronary artery without angina pectoris: I25.10

## 2023-10-13 HISTORY — PX: CATARACT EXTRACTION W/PHACO: SHX586

## 2023-10-13 SURGERY — PHACOEMULSIFICATION, CATARACT, WITH IOL INSERTION
Anesthesia: Monitor Anesthesia Care | Laterality: Left

## 2023-10-13 MED ORDER — SIGHTPATH DOSE#1 NA HYALUR & NA CHOND-NA HYALUR IO KIT
PACK | INTRAOCULAR | Status: DC | PRN
  Administered 2023-10-13: 1 via OPHTHALMIC

## 2023-10-13 MED ORDER — TETRACAINE HCL 0.5 % OP SOLN
OPHTHALMIC | Status: AC
Start: 2023-10-13 — End: 2023-10-13
  Filled 2023-10-13: qty 4

## 2023-10-13 MED ORDER — MIDAZOLAM HCL 2 MG/2ML IJ SOLN
INTRAMUSCULAR | Status: AC
  Filled 2023-10-13: qty 2

## 2023-10-13 MED ORDER — LIDOCAINE HCL (PF) 2 % IJ SOLN
INTRAOCULAR | Status: DC | PRN
  Administered 2023-10-13: 1 mL

## 2023-10-13 MED ORDER — TETRACAINE HCL 0.5 % OP SOLN
1.0000 [drp] | OPHTHALMIC | Status: DC | PRN
  Administered 2023-10-13 (×3): 1 [drp] via OPHTHALMIC

## 2023-10-13 MED ORDER — FENTANYL CITRATE (PF) 100 MCG/2ML IJ SOLN
INTRAMUSCULAR | Status: AC
  Filled 2023-10-13: qty 2

## 2023-10-13 MED ORDER — LACTATED RINGERS IV SOLN: INTRAVENOUS | Status: DC

## 2023-10-13 MED ORDER — SIGHTPATH DOSE#1 BSS IO SOLN
INTRAOCULAR | Status: DC | PRN
  Administered 2023-10-13: 15 mL via INTRAOCULAR

## 2023-10-13 MED ORDER — FENTANYL CITRATE (PF) 100 MCG/2ML IJ SOLN
INTRAMUSCULAR | Status: DC | PRN
  Administered 2023-10-13: 50 ug via INTRAVENOUS

## 2023-10-13 MED ORDER — ARMC OPHTHALMIC DILATING DROPS
OPHTHALMIC | Status: AC
  Filled 2023-10-13: qty 0.5

## 2023-10-13 MED ORDER — MOXIFLOXACIN HCL 0.5 % OP SOLN
OPHTHALMIC | Status: DC | PRN
  Administered 2023-10-13: .2 mL via OPHTHALMIC

## 2023-10-13 MED ORDER — ARMC OPHTHALMIC DILATING DROPS
1.0000 | OPHTHALMIC | Status: DC | PRN
  Administered 2023-10-13 (×3): 1 via OPHTHALMIC

## 2023-10-13 MED ORDER — SIGHTPATH DOSE#1 BSS IO SOLN
INTRAOCULAR | Status: DC | PRN
  Administered 2023-10-13: 81 mL via OPHTHALMIC

## 2023-10-13 MED ORDER — BRIMONIDINE TARTRATE-TIMOLOL 0.2-0.5 % OP SOLN
OPHTHALMIC | Status: DC | PRN
  Administered 2023-10-13: 1 [drp] via OPHTHALMIC

## 2023-10-13 MED ORDER — MIDAZOLAM HCL 2 MG/2ML IJ SOLN
INTRAMUSCULAR | Status: DC | PRN
  Administered 2023-10-13 (×2): 1 mg via INTRAVENOUS

## 2023-10-13 SURGICAL SUPPLY — 9 items
CATARACT SUITE SIGHTPATH (MISCELLANEOUS) ×1 IMPLANT
FEE CATARACT SUITE SIGHTPATH (MISCELLANEOUS) ×1 IMPLANT
GLOVE BIOGEL PI IND STRL 8 (GLOVE) ×1 IMPLANT
GLOVE SURG LX STRL 7.5 STRW (GLOVE) ×1 IMPLANT
GLOVE SURG SYN 6.5 PF PI BL (GLOVE) ×1 IMPLANT
LENS IOL TECNIS EYHANCE 21.0 (Intraocular Lens) IMPLANT
NDL FILTER BLUNT 18X1 1/2 (NEEDLE) ×1 IMPLANT
NEEDLE FILTER BLUNT 18X1 1/2 (NEEDLE) ×1 IMPLANT
SYR 3ML LL SCALE MARK (SYRINGE) ×1 IMPLANT

## 2023-10-13 NOTE — H&P (Signed)
 [facility]   Primary Care Physician:  Epifanio Alm SQUIBB, MD Ophthalmologist: Dr. Dene Etienne  Pre-Procedure History & Physical: HPI:  Frederick Brewer is a 73 y.o. male here for ophthalmic surgery.   Past Medical History:  Diagnosis Date   CAD (coronary artery disease)    COPD (chronic obstructive pulmonary disease) (HCC)     Past Surgical History:  Procedure Laterality Date   NO PAST SURGERIES      Prior to Admission medications   Medication Sig Start Date End Date Taking? Authorizing Provider  fluticasone-salmeterol (ADVAIR HFA) 45-21 MCG/ACT inhaler Inhale 2 puffs into the lungs 2 (two) times daily.   Yes [provider]  fluticasone-salmeterol (WIXELA INHUB) 100-50 MCG/ACT AEPB Inhale 1 puff into the lungs 2 (two) times daily.   Yes [provider]  melatonin 3 MG TABS tablet Take 3 mg by mouth at bedtime.   Yes [provider]  albuterol  (VENTOLIN  HFA) 108 (90 Base) MCG/ACT inhaler Inhale 2 puffs into the lungs every 6 (six) hours as needed for wheezing or shortness of breath. 03/27/22   Jerri Keys, MD  ibuprofen (ADVIL) 200 MG tablet Take 200 mg by mouth every 6 (six) hours as needed for mild pain.    [provider]    Allergies as of 09/09/2023 - Review Complete 03/24/2022  Allergen Reaction Noted   Penicillins Rash 03/20/2022    History reviewed. No pertinent family history.  Social History   Socioeconomic History   Marital status: Divorced    Spouse name: Not on file   Number of children: Not on file   Years of education: Not on file   Highest education level: Not on file  Occupational History   Not on file  Tobacco Use   Smoking status: Former    Current packs/day: 1.00    Types: Cigarettes   Smokeless tobacco: Never  Substance and Sexual Activity   Alcohol use: Not Currently   Drug use: Never   Sexual activity: Not on file  Other Topics Concern   Not on file  Social History Narrative   Not on file    Social Drivers of Health   Financial Resource Strain: Low Risk  (09/20/2023)   Received from [facility]   Overall Financial Resource Strain (CARDIA)    Difficulty of Paying Living Expenses: Not hard at all  Food Insecurity: No Food Insecurity (09/20/2023)   Received from [facility]   Hunger Vital Sign    Within the past 12 months, you worried that your food would run out before you got the money to buy more.: Never true    Within the past 12 months, the food you bought just didn't last and you didn't have money to get more.: Never true  Transportation Needs: No Transportation Needs (09/20/2023)   Received from [facility] - Transportation    In the past 12 months, has lack of transportation kept you from medical appointments or from getting medications?: No    Lack of Transportation (Non-Medical): No  Physical Activity: Not on file  Stress: Not on file  Social Connections: Not on file  Intimate Partner Violence: Not At Risk (03/23/2022)   Humiliation, Afraid, Rape, and Kick questionnaire    Fear of Current or Ex-Partner: No    Emotionally Abused: No    Physically Abused: No    Sexually Abused: No    Review of Systems: See HPI, otherwise negative ROS  Physical Exam: BP (!) 142/73   Pulse 73   Temp 97.9 F (36.6 C) (Temporal)   Resp 18   Ht 5' 10 (1.778 m)   Wt 78.9 kg   SpO2 96%   BMI 24.97 kg/m  General:   Alert,  pleasant and cooperative in NAD Head:  Normocephalic and atraumatic. Lungs:  Clear to auscultation.    Heart:  Regular rate and rhythm.   Impression/Plan: Frederick Brewer is here for ophthalmic surgery.  Risks, benefits, limitations, and alternatives regarding ophthalmic surgery have been reviewed with the patient.  Questions have been answered.  All parties agreeable.   Frederick GASKIN, MD  10/13/2023, 9:49 AM

## 2023-10-13 NOTE — Anesthesia Postprocedure Evaluation (Signed)
 Anesthesia Post Note  Patient: Frederick Brewer  Procedure(s) Performed: PHACOEMULSIFICATION, CATARACT, WITH IOL INSERTION 7.86, 00:48.4 (Left)  Patient location during evaluation: PACU Anesthesia Type: MAC Level of consciousness: awake and alert Pain management: pain level controlled Vital Signs Assessment: post-procedure vital signs reviewed and stable Respiratory status: spontaneous breathing, nonlabored ventilation, respiratory function stable and patient connected to nasal cannula oxygen Cardiovascular status: stable and blood pressure returned to baseline Postop Assessment: no apparent nausea or vomiting Anesthetic complications: no   No notable events documented.   Last Vitals:  Vitals:   10/13/23 1054 10/13/23 1059  BP: (!) 141/81 (!) 141/79  Pulse: 71 71  Resp: (!) 22 14  Temp: (!) 36.3 C (!) 36.3 C  SpO2: 98% 98%    Last Pain:  Vitals:   10/13/23 1059  TempSrc:   PainSc: 0-No pain                 Lennis Rader C Tyrome Donatelli

## 2023-10-13 NOTE — Transfer of Care (Signed)
 Immediate Anesthesia Transfer of Care Note  Patient: Frederick Brewer  Procedure(s) Performed: PHACOEMULSIFICATION, CATARACT, WITH IOL INSERTION 7.86, 00:48.4 (Left)  Patient Location: PACU  Anesthesia Type: MAC  Level of Consciousness: awake, alert  and patient cooperative  Airway and Oxygen Therapy: Patient Spontanous Breathing and Patient connected to supplemental oxygen  Post-op Assessment: Post-op Vital signs reviewed, Patient's Cardiovascular Status Stable, Respiratory Function Stable, Patent Airway and No signs of Nausea or vomiting  Post-op Vital Signs: Reviewed and stable  Complications: No notable events documented.

## 2023-10-13 NOTE — Op Note (Signed)
 OPERATIVE NOTE  Frederick Brewer [id number] 10/13/2023   PREOPERATIVE DIAGNOSIS:  Nuclear sclerotic cataract left eye. H25.12   POSTOPERATIVE DIAGNOSIS:    Nuclear sclerotic cataract left eye.     PROCEDURE:  Phacoemusification with posterior chamber intraocular lens placement of the left eye  Ultrasound time: Procedure(s): PHACOEMULSIFICATION, CATARACT, WITH IOL INSERTION 7.86, 00:48.4 (Left)  LENS:   Implant Name Type Inv. Item Serial No. Manufacturer Lot No. LRB No. Used Action  LENS IOL TECNIS EYHANCE 21.0 - [id number] Intraocular Lens LENS IOL TECNIS EYHANCE 21.0 [id number] [employer]  Left 1 Implanted      SURGEON:  Dene FABIENE Etienne, MD   ANESTHESIA:  Topical with tetracaine  drops and 2% Xylocaine  jelly, augmented with 1% preservative-free intracameral lidocaine .    COMPLICATIONS:  None.   DESCRIPTION OF PROCEDURE:  The patient was identified in the holding room and transported to the operating room and placed in the supine position under the operating microscope.  The left eye was identified as the operative eye and it was prepped and draped in the usual sterile ophthalmic fashion.   A 1 millimeter clear-corneal paracentesis was made at the 1:30 position.  0.5 ml of preservative-free 1% lidocaine  was injected into the anterior chamber.  The anterior chamber was filled with Viscoat viscoelastic.  A 2.4 millimeter keratome was used to make a near-clear corneal incision at the 10:30 position.  .  A curvilinear capsulorrhexis was made with a cystotome and capsulorrhexis forceps.  Balanced salt solution was used to hydrodissect and hydrodelineate the nucleus.   Phacoemulsification was then used in stop and chop fashion to remove the lens nucleus and epinucleus.  The remaining cortex was then removed using the irrigation and aspiration handpiece. Provisc was then placed into the capsular bag to distend it for lens placement.  A lens was then injected into the capsular bag.  The  remaining viscoelastic was aspirated.   Wounds were hydrated with balanced salt solution.  The anterior chamber was inflated to a physiologic pressure with balanced salt solution.  No wound leaks were noted. Vigamox  0.2 ml of a 1mg  per ml solution was injected into the anterior chamber for a dose of 0.2 mg of intracameral antibiotic at the completion of the case.   Timolol  and Brimonidine  drops were applied to the eye.  The patient was taken to the recovery room in stable condition without complications of anesthesia or surgery.  [name] 10/13/2023, 10:52 AM

## 2023-10-20 NOTE — Anesthesia Preprocedure Evaluation (Addendum)
 Anesthesia Evaluation  Patient identified by MRN, date of birth, ID band Patient awake    Reviewed: Allergy & Precautions, H&P , NPO status , Patient's Chart, lab work & pertinent test results  Airway Mallampati: III  TM Distance: >3 FB     Dental  (+) Partial Upper   Pulmonary neg pulmonary ROS, pneumonia, COPD, former smoker Severe emphysema   Office note April 2025:  1. Advanced chronic obstructive pulmonary disease (CMS/HHS-HCC) Recent FEV1 25%.  Continue Advair BID and Duo neb PRN up to 4 times daily - he reports effectiveness and Mmrc is 2           Cardiovascular + CAD  negative cardio ROS      Neuro/Psych negative neurological ROS  negative psych ROS   GI/Hepatic negative GI ROS, Neg liver ROS,,,  Endo/Other  negative endocrine ROS    Renal/GU negative Renal ROS  negative genitourinary   Musculoskeletal negative musculoskeletal ROS (+)    Abdominal   Peds negative pediatric ROS (+)  Hematology negative hematology ROS (+)   Anesthesia Other Findings Previous cataract surgery 10-13-23 Dr. Ola   Severe emphysema FEF 25% CAD COPD Can't sit back, likes to sit straight up for his back     Reproductive/Obstetrics negative OB ROS                              Anesthesia Physical Anesthesia Plan  ASA: 3  Anesthesia Plan: MAC   Post-op Pain Management:    Induction: Intravenous  PONV Risk Score and Plan:   Airway Management Planned: Natural Airway and Nasal Cannula  Additional Equipment:   Intra-op Plan:   Post-operative Plan:   Informed Consent: I have reviewed the patients History and Physical, chart, labs and discussed the procedure including the risks, benefits and alternatives for the proposed anesthesia with the patient or authorized representative who has indicated his/her understanding and acceptance.     Dental Advisory Given  Plan Discussed with:  Anesthesiologist, CRNA and Surgeon  Anesthesia Plan Comments: (Patient consented for risks of anesthesia including but not limited to:  - adverse reactions to medications - damage to eyes, teeth, lips or other oral mucosa - nerve damage due to positioning  - sore throat or hoarseness - Damage to heart, brain, nerves, lungs, other parts of body or loss of life  Patient voiced understanding and assent.)         Anesthesia Quick Evaluation

## 2023-10-28 NOTE — Discharge Instructions (Signed)

## 2023-11-03 ENCOUNTER — Ambulatory Visit
Admission: RE | Admit: 2023-11-03 | Discharge: 2023-11-03 | Disposition: A | Discharge status: Home / Self Care | Attending: Ophthalmology | Admitting: Ophthalmology

## 2023-11-03 ENCOUNTER — Encounter
Admission: RE | Disposition: A | Discharge status: Home / Self Care | Payer: Self-pay | Source: Home / Self Care | Attending: Ophthalmology

## 2023-11-03 ENCOUNTER — Encounter: Payer: Self-pay | Admitting: Ophthalmology

## 2023-11-03 ENCOUNTER — Other Ambulatory Visit: Payer: Self-pay

## 2023-11-03 ENCOUNTER — Ambulatory Visit: Payer: Self-pay | Admitting: Anesthesiology

## 2023-11-03 DIAGNOSIS — J439 Emphysema, unspecified: Secondary | ICD-10-CM | POA: Insufficient documentation

## 2023-11-03 DIAGNOSIS — H2511 Age-related nuclear cataract, right eye: Secondary | ICD-10-CM | POA: Diagnosis present

## 2023-11-03 DIAGNOSIS — I2510 Atherosclerotic heart disease of native coronary artery without angina pectoris: Secondary | ICD-10-CM | POA: Insufficient documentation

## 2023-11-03 DIAGNOSIS — Z87891 Personal history of nicotine dependence: Secondary | ICD-10-CM | POA: Diagnosis not present

## 2023-11-03 HISTORY — PX: CATARACT EXTRACTION W/PHACO: SHX586

## 2023-11-03 SURGERY — PHACOEMULSIFICATION, CATARACT, WITH IOL INSERTION
Anesthesia: Monitor Anesthesia Care | Laterality: Right

## 2023-11-03 MED ORDER — LACTATED RINGERS IV SOLN: INTRAVENOUS | Status: DC

## 2023-11-03 MED ORDER — TETRACAINE HCL 0.5 % OP SOLN
1.0000 [drp] | OPHTHALMIC | Status: DC | PRN
  Administered 2023-11-03 (×6): 1 [drp] via OPHTHALMIC

## 2023-11-03 MED ORDER — MIDAZOLAM HCL 2 MG/2ML IJ SOLN
INTRAMUSCULAR | Status: DC | PRN
  Administered 2023-11-03 (×2): 2 mg via INTRAVENOUS

## 2023-11-03 MED ORDER — BRIMONIDINE TARTRATE-TIMOLOL 0.2-0.5 % OP SOLN
OPHTHALMIC | Status: DC | PRN
  Administered 2023-11-03 (×2): 1 [drp] via OPHTHALMIC

## 2023-11-03 MED ORDER — SIGHTPATH DOSE#1 BSS IO SOLN
INTRAOCULAR | Status: DC | PRN
  Administered 2023-11-03 (×2): 15 mL via INTRAOCULAR

## 2023-11-03 MED ORDER — LIDOCAINE HCL (PF) 2 % IJ SOLN
INTRAOCULAR | Status: DC | PRN
  Administered 2023-11-03 (×2): 2 mL

## 2023-11-03 MED ORDER — ARMC OPHTHALMIC DILATING DROPS
1.0000 | OPHTHALMIC | Status: DC | PRN
  Administered 2023-11-03 (×6): 1 via OPHTHALMIC

## 2023-11-03 MED ORDER — FENTANYL CITRATE (PF) 100 MCG/2ML IJ SOLN
INTRAMUSCULAR | Status: AC
  Filled 2023-11-03: qty 2

## 2023-11-03 MED ORDER — SIGHTPATH DOSE#1 BSS IO SOLN
INTRAOCULAR | Status: DC | PRN
  Administered 2023-11-03 (×2): 63 mL via OPHTHALMIC

## 2023-11-03 MED ORDER — MIDAZOLAM HCL 2 MG/2ML IJ SOLN
INTRAMUSCULAR | Status: AC
  Filled 2023-11-03: qty 2

## 2023-11-03 MED ORDER — FENTANYL CITRATE (PF) 100 MCG/2ML IJ SOLN
INTRAMUSCULAR | Status: DC | PRN
  Administered 2023-11-03 (×2): 50 ug via INTRAVENOUS

## 2023-11-03 MED ORDER — MOXIFLOXACIN HCL 0.5 % OP SOLN
OPHTHALMIC | Status: DC | PRN
  Administered 2023-11-03 (×2): .2 mL via OPHTHALMIC

## 2023-11-03 MED ORDER — SIGHTPATH DOSE#1 NA HYALUR & NA CHOND-NA HYALUR IO KIT
PACK | INTRAOCULAR | Status: DC | PRN
  Administered 2023-11-03 (×2): 1 via OPHTHALMIC

## 2023-11-03 SURGICAL SUPPLY — 8 items
FEE CATARACT SUITE SIGHTPATH (MISCELLANEOUS) ×1 IMPLANT
GLOVE BIOGEL PI IND STRL 8 (GLOVE) ×1 IMPLANT
GLOVE SURG LX STRL 7.5 STRW (GLOVE) ×1 IMPLANT
GLOVE SURG SYN 6.5 PF PI BL (GLOVE) ×1 IMPLANT
LENS IOL TECNIS EYHANCE 20.0 (Intraocular Lens) IMPLANT
NDL FILTER BLUNT 18X1 1/2 (NEEDLE) ×1 IMPLANT
NEEDLE FILTER BLUNT 18X1 1/2 (NEEDLE) ×1 IMPLANT
SYR 3ML LL SCALE MARK (SYRINGE) ×1 IMPLANT

## 2023-11-03 NOTE — Op Note (Signed)
 LOCATION:  [facility]   PREOPERATIVE DIAGNOSIS:    Nuclear sclerotic cataract right eye. H25.11   POSTOPERATIVE DIAGNOSIS:  Nuclear sclerotic cataract right eye.     PROCEDURE:  Phacoemusification with posterior chamber intraocular lens placement of the right eye   ULTRASOUND TIME: Procedure(s): PHACOEMULSIFICATION, CATARACT, WITH IOL INSERTION 9.51 01:06.1 (Right)  LENS:   Implant Name Type Inv. Item Serial No. Manufacturer Lot No. LRB No. Used Action  LENS IOL TECNIS EYHANCE 20.0 - [id number] Intraocular Lens LENS IOL TECNIS EYHANCE 20.0 [id number] [employer]  Right 1 Implanted         SURGEON:  Dene FABIENE Etienne, MD   ANESTHESIA:  Topical with tetracaine  drops and 2% Xylocaine  jelly, augmented with 1% preservative-free intracameral lidocaine .    COMPLICATIONS:  None.   DESCRIPTION OF PROCEDURE:  The patient was identified in the holding room and transported to the operating room and placed in the supine position under the operating microscope.  The right eye was identified as the operative eye and it was prepped and draped in the usual sterile ophthalmic fashion.   A 1 millimeter clear-corneal paracentesis was made at the 12:00 position.  0.5 ml of preservative-free 1% lidocaine  was injected into the anterior chamber. The anterior chamber was filled with Viscoat viscoelastic.  A 2.4 millimeter keratome was used to make a near-clear corneal incision at the 9:00 position.  A curvilinear capsulorrhexis was made with a cystotome and capsulorrhexis forceps.  Balanced salt solution was used to hydrodissect and hydrodelineate the nucleus.   Phacoemulsification was then used in stop and chop fashion to remove the lens nucleus and epinucleus.  The remaining cortex was then removed using the irrigation and aspiration handpiece. Provisc was then placed into the capsular bag to distend it for lens placement.  A lens was then injected into the capsular bag.  The remaining  viscoelastic was aspirated.   Wounds were hydrated with balanced salt solution.  The anterior chamber was inflated to a physiologic pressure with balanced salt solution.  No wound leaks were noted. Vigamox  0.2 ml of a 1mg  per ml solution was injected into the anterior chamber for a dose of 0.2 mg of intracameral antibiotic at the completion of the case.   Timolol  and Brimonidine  drops were applied to the eye.  The patient was taken to the recovery room in stable condition without complications of anesthesia or surgery.   Nyshawn Gowdy 11/03/2023, 10:49 AM

## 2023-11-03 NOTE — Anesthesia Postprocedure Evaluation (Signed)
 Anesthesia Post Note  Patient: CHADWICK REISWIG  Procedure(s) Performed: PHACOEMULSIFICATION, CATARACT, WITH IOL INSERTION 9.51 01:06.1 (Right)  Patient location during evaluation: PACU Anesthesia Type: MAC Level of consciousness: awake and alert Pain management: pain level controlled Vital Signs Assessment: post-procedure vital signs reviewed and stable Respiratory status: spontaneous breathing, nonlabored ventilation, respiratory function stable and patient connected to nasal cannula oxygen Cardiovascular status: stable and blood pressure returned to baseline Postop Assessment: no apparent nausea or vomiting Anesthetic complications: no   No notable events documented.   Last Vitals:  Vitals:   11/03/23 1052 11/03/23 1058  BP: (!) 148/82 (!) 142/86  Pulse:  77  Resp:  13  Temp: 36.9 C 36.9 C  SpO2:  94%    Last Pain:  Vitals:   11/03/23 1058  TempSrc:   PainSc: 0-No pain                 [employer] C Braeden Kennan

## 2023-11-03 NOTE — H&P (Signed)
 [facility]   Primary Care Physician:  Epifanio Alm SQUIBB, MD Ophthalmologist: Dr. Dene Etienne  Pre-Procedure History & Physical: HPI:  Frederick Brewer is a 73 y.o. male here for ophthalmic surgery.   Past Medical History:  Diagnosis Date   CAD (coronary artery disease)    COPD (chronic obstructive pulmonary disease) (HCC)     Past Surgical History:  Procedure Laterality Date   CATARACT EXTRACTION W/PHACO Left 10/13/2023   Procedure: PHACOEMULSIFICATION, CATARACT, WITH IOL INSERTION 7.86, 00:48.4;  Surgeon: Etienne Dene, MD;  Location: [facility];  Service: Ophthalmology;  Laterality: Left;   NO PAST SURGERIES      Prior to Admission medications   Medication Sig Start Date End Date Taking? Authorizing Provider  albuterol  (VENTOLIN  HFA) 108 (90 Base) MCG/ACT inhaler Inhale 2 puffs into the lungs every 6 (six) hours as needed for wheezing or shortness of breath. 03/27/22  Yes Jerri Keys, MD  fluticasone-salmeterol (ADVAIR HFA) 45-21 MCG/ACT inhaler Inhale 2 puffs into the lungs 2 (two) times daily.   Yes [provider]  fluticasone-salmeterol (WIXELA INHUB) 100-50 MCG/ACT AEPB Inhale 1 puff into the lungs 2 (two) times daily.   Yes [provider]  ibuprofen (ADVIL) 200 MG tablet Take 200 mg by mouth every 6 (six) hours as needed for mild pain.   Yes [provider]  melatonin 3 MG TABS tablet Take 3 mg by mouth at bedtime.   Yes [provider]    Allergies as of 09/09/2023 - Review Complete 03/24/2022  Allergen Reaction Noted   Penicillins Rash 03/20/2022    No family history on file.  Social History   Socioeconomic History   Marital status: Divorced    Spouse name: Not on file   Number of children: Not on file   Years of education: Not on file   Highest education level: Not on file  Occupational History   Not on file  Tobacco Use   Smoking status: Former    Current packs/day: 1.00    Types:  Cigarettes   Smokeless tobacco: Never  Substance and Sexual Activity   Alcohol use: Not Currently   Drug use: Never   Sexual activity: Not on file  Other Topics Concern   Not on file  Social History Narrative   Not on file   Social Drivers of Health   Financial Resource Strain: Low Risk  (09/20/2023)   Received from [facility]   Overall Financial Resource Strain (CARDIA)    Difficulty of Paying Living Expenses: Not hard at all  Food Insecurity: No Food Insecurity (09/20/2023)   Received from [facility]   Hunger Vital Sign    Within the past 12 months, you worried that your food would run out before you got the money to buy more.: Never true    Within the past 12 months, the food you bought just didn't last and you didn't have money to get more.: Never true  Transportation Needs: No Transportation Needs (09/20/2023)   Received from [facility]    In the past 12 months, has lack of transportation kept you from medical appointments or from getting medications?: No    Lack of Transportation (Non-Medical): No  Physical Activity: Not on file  Stress: Not on file  Social Connections: Not on file  Intimate Partner Violence: Not At Risk (03/23/2022)   Humiliation, Afraid, Rape, and Kick questionnaire    Fear of Current  or Ex-Partner: No    Emotionally Abused: No    Physically Abused: No    Sexually Abused: No    Review of Systems: See HPI, otherwise negative ROS  Physical Exam: BP 136/78   Pulse 80   Temp 98.5 F (36.9 C) (Temporal)   Resp (!) 21   Ht 5' 10 (1.778 m)   Wt 77.1 kg   SpO2 96%   BMI 24.39 kg/m  General:   Alert,  pleasant and cooperative in NAD Head:  Normocephalic and atraumatic. Lungs:  Clear to auscultation.    Heart:  Regular rate and rhythm.   Impression/Plan: Frederick Brewer is here for ophthalmic surgery.  Risks, benefits, limitations, and alternatives regarding  ophthalmic surgery have been reviewed with the patient.  Questions have been answered.  All parties agreeable.   [name], MD  11/03/2023, 10:03 AM

## 2023-11-03 NOTE — Transfer of Care (Signed)
 Immediate Anesthesia Transfer of Care Note  Patient: Frederick Brewer  Procedure(s) Performed: PHACOEMULSIFICATION, CATARACT, WITH IOL INSERTION 9.51 01:06.1 (Right)  Patient Location: PACU  Anesthesia Type: MAC  Level of Consciousness: awake, alert  and patient cooperative  Airway and Oxygen Therapy: Patient Spontanous Breathing and Patient connected to supplemental oxygen  Post-op Assessment: Post-op Vital signs reviewed, Patient's Cardiovascular Status Stable, Respiratory Function Stable, Patent Airway and No signs of Nausea or vomiting  Post-op Vital Signs: Reviewed and stable  Complications: No notable events documented.

## 2023-11-04 ENCOUNTER — Encounter: Payer: Self-pay | Admitting: Ophthalmology
